# Patient Record
Sex: Female | Born: 1960
Health system: Southern US, Community
[De-identification: ages and names within clinical notes are randomized; demographics above are authoritative.]

## PROBLEM LIST (undated history)

## (undated) DIAGNOSIS — Z860100 Personal history of colon polyps, unspecified: Secondary | ICD-10-CM

## (undated) HISTORY — DX: Personal history of colon polyps, unspecified: Z86.0100

## (undated) HISTORY — DX: Personal history of colonic polyps: Z86.010

## (undated) HISTORY — PX: CHOLECYSTECTOMY: SHX55

## (undated) HISTORY — PX: INCISION / DRAINAGE HAND / FINGER: SUR695

## (undated) HISTORY — PX: ABDOMINAL HYSTERECTOMY: SHX81

---

## 1999-11-04 ENCOUNTER — Other Ambulatory Visit: Admission: RE | Admit: 1999-11-04 | Discharge: 1999-11-04 | Payer: Self-pay | Admitting: Obstetrics and Gynecology

## 2004-05-07 ENCOUNTER — Ambulatory Visit: Payer: Self-pay | Admitting: Obstetrics and Gynecology

## 2005-06-16 ENCOUNTER — Ambulatory Visit: Payer: Self-pay | Admitting: Internal Medicine

## 2005-06-17 ENCOUNTER — Ambulatory Visit: Payer: Self-pay | Admitting: Internal Medicine

## 2005-06-18 ENCOUNTER — Ambulatory Visit: Payer: Self-pay | Admitting: Obstetrics and Gynecology

## 2005-07-15 ENCOUNTER — Ambulatory Visit: Payer: Self-pay | Admitting: Surgery

## 2005-07-19 ENCOUNTER — Ambulatory Visit: Payer: Self-pay | Admitting: Gastroenterology

## 2006-02-07 ENCOUNTER — Inpatient Hospital Stay (HOSPITAL_COMMUNITY): Admission: RE | Admit: 2006-02-07 | Discharge: 2006-02-08 | Payer: Self-pay | Admitting: Obstetrics and Gynecology

## 2006-07-27 ENCOUNTER — Ambulatory Visit: Payer: Self-pay | Admitting: Obstetrics and Gynecology

## 2007-08-23 ENCOUNTER — Ambulatory Visit: Payer: Self-pay | Admitting: Internal Medicine

## 2008-12-11 LAB — HM MAMMOGRAPHY: HM Mammogram: NORMAL

## 2009-12-31 LAB — HEPATIC FUNCTION PANEL
AST: 20 U/L (ref 13–35)
Alkaline Phosphatase: 71 U/L (ref 25–125)
Bilirubin, Total: 0.4 mg/dL

## 2009-12-31 LAB — CBC AND DIFFERENTIAL
Platelets: 305 10*3/uL (ref 150–399)
WBC: 4.9 10^3/mL

## 2009-12-31 LAB — LIPID PANEL: LDL Cholesterol: 86 mg/dL

## 2010-01-27 ENCOUNTER — Ambulatory Visit: Payer: Self-pay | Admitting: Internal Medicine

## 2010-02-04 LAB — BASIC METABOLIC PANEL
BUN: 22 mg/dL — AB (ref 4–21)
Creatinine: 0.7 mg/dL (ref 0.5–1.1)
Potassium: 3.8 mmol/L (ref 3.4–5.3)

## 2010-05-01 ENCOUNTER — Ambulatory Visit: Payer: Self-pay | Admitting: Gastroenterology

## 2010-05-01 LAB — HM COLONOSCOPY: HM Colonoscopy: 2

## 2011-03-12 ENCOUNTER — Telehealth: Payer: Self-pay | Admitting: Internal Medicine

## 2011-03-12 DIAGNOSIS — Z1231 Encounter for screening mammogram for malignant neoplasm of breast: Secondary | ICD-10-CM

## 2011-03-12 DIAGNOSIS — Z1239 Encounter for other screening for malignant neoplasm of breast: Secondary | ICD-10-CM

## 2011-03-12 NOTE — Telephone Encounter (Signed)
Patient is needing an order for a mammogram.

## 2011-03-12 NOTE — Telephone Encounter (Signed)
Pending signature

## 2011-03-12 NOTE — Telephone Encounter (Signed)
Patient informed ready for pick up.

## 2011-05-05 ENCOUNTER — Ambulatory Visit: Payer: Self-pay | Admitting: Internal Medicine

## 2011-05-17 ENCOUNTER — Encounter: Payer: Self-pay | Admitting: Internal Medicine

## 2011-05-17 DIAGNOSIS — R040 Epistaxis: Secondary | ICD-10-CM

## 2011-05-17 DIAGNOSIS — I10 Essential (primary) hypertension: Secondary | ICD-10-CM

## 2011-05-17 DIAGNOSIS — M25569 Pain in unspecified knee: Secondary | ICD-10-CM

## 2011-06-01 ENCOUNTER — Other Ambulatory Visit: Payer: Self-pay | Admitting: Internal Medicine

## 2011-06-24 ENCOUNTER — Encounter: Payer: Self-pay | Admitting: Internal Medicine

## 2011-06-24 DIAGNOSIS — I10 Essential (primary) hypertension: Secondary | ICD-10-CM | POA: Insufficient documentation

## 2011-06-24 DIAGNOSIS — R040 Epistaxis: Secondary | ICD-10-CM | POA: Insufficient documentation

## 2011-06-24 DIAGNOSIS — M25569 Pain in unspecified knee: Secondary | ICD-10-CM | POA: Insufficient documentation

## 2011-06-24 LAB — HM MAMMOGRAPHY: HM Mammogram: NORMAL

## 2011-07-20 ENCOUNTER — Encounter: Payer: Self-pay | Admitting: Internal Medicine

## 2011-07-20 ENCOUNTER — Ambulatory Visit (INDEPENDENT_AMBULATORY_CARE_PROVIDER_SITE_OTHER): Payer: 59 | Admitting: Internal Medicine

## 2011-07-20 VITALS — BP 128/82 | HR 96 | Temp 98.7°F | Wt 160.2 lb

## 2011-07-20 DIAGNOSIS — Z0000 Encounter for general adult medical examination without abnormal findings: Secondary | ICD-10-CM

## 2011-07-20 DIAGNOSIS — I10 Essential (primary) hypertension: Secondary | ICD-10-CM

## 2011-07-20 LAB — COMPREHENSIVE METABOLIC PANEL
Albumin: 4.3 g/dL (ref 3.5–5.2)
Alkaline Phosphatase: 72 U/L (ref 39–117)
CO2: 27 mEq/L (ref 19–32)
Chloride: 103 mEq/L (ref 96–112)
Glucose, Bld: 88 mg/dL (ref 70–99)
Potassium: 3.8 mEq/L (ref 3.5–5.1)
Sodium: 140 mEq/L (ref 135–145)
Total Protein: 7.5 g/dL (ref 6.0–8.3)

## 2011-07-20 LAB — CBC WITH DIFFERENTIAL/PLATELET
Basophils Relative: 0.7 % (ref 0.0–3.0)
Hemoglobin: 15 g/dL (ref 12.0–15.0)
Lymphocytes Relative: 39.2 % (ref 12.0–46.0)
MCHC: 34.2 g/dL (ref 30.0–36.0)
Monocytes Relative: 6.5 % (ref 3.0–12.0)
Neutro Abs: 2.3 10*3/uL (ref 1.4–7.7)
RBC: 4.49 Mil/uL (ref 3.87–5.11)

## 2011-07-20 LAB — LIPID PANEL
Cholesterol: 188 mg/dL (ref 0–200)
LDL Cholesterol: 101 mg/dL — ABNORMAL HIGH (ref 0–99)

## 2011-07-20 NOTE — Progress Notes (Signed)
  Subjective:    Patient ID: Kristy Vega, female    DOB: 1960/07/31, 51 y.o.   MRN: [id number]  HPI 51YO female presents for annual exam. Doing well. No complaints today. Has been following healthy diet, increasing intake of vegetables. In regards to HTN, reports compliance with HCTZ.   Outpatient Encounter Prescriptions as of 07/20/2011  Medication Sig Dispense Refill  . hydrochlorothiazide (MICROZIDE) 12.5 MG capsule TAKE ONE CAPSULE DAILY  30 capsule  10    Review of Systems  Constitutional: Negative for fever, chills, appetite change, fatigue and unexpected weight change.  HENT: Negative for ear pain, congestion, sore throat, trouble swallowing, neck pain, voice change and sinus pressure.   Eyes: Negative for visual disturbance.  Respiratory: Negative for cough, shortness of breath, wheezing and stridor.   Cardiovascular: Negative for chest pain, palpitations and leg swelling.  Gastrointestinal: Negative for nausea, vomiting, abdominal pain, diarrhea, constipation, blood in stool, abdominal distention and anal bleeding.  Genitourinary: Negative for dysuria and flank pain.  Musculoskeletal: Positive for arthralgias (knee pain bilaterally). Negative for myalgias and gait problem.  Skin: Negative for color change and rash.  Neurological: Negative for dizziness and headaches.  Hematological: Negative for adenopathy. Does not bruise/bleed easily.  Psychiatric/Behavioral: Negative for suicidal ideas, sleep disturbance and dysphoric mood. The patient is not nervous/anxious.    BP 128/82  Pulse 96  Temp(Src) 98.7 F (37.1 C) (Oral)  Wt 160 lb 4 oz (72.689 kg)  SpO2 99%     Objective:   Physical Exam  Constitutional: She is oriented to person, place, and time. She appears well-developed and well-nourished. No distress.  HENT:  Head: Normocephalic and atraumatic.  Right Ear: External ear normal.  Left Ear: External ear normal.  Nose: Nose normal.  Mouth/Throat: Oropharynx is clear  and moist. No oropharyngeal exudate.  Eyes: Conjunctivae are normal. Pupils are equal, round, and reactive to light. Right eye exhibits no discharge. Left eye exhibits no discharge. No scleral icterus.  Neck: Normal range of motion. Neck supple. No tracheal deviation present. No thyromegaly present.  Cardiovascular: Normal rate, regular rhythm, normal heart sounds and intact distal pulses.  Exam reveals no gallop and no friction rub.   No murmur heard. Pulmonary/Chest: Effort normal and breath sounds normal. No accessory muscle usage. Not tachypneic. No respiratory distress. She has no wheezes. She has no rales. She exhibits no tenderness. Right breast exhibits no inverted nipple, no mass, no nipple discharge, no skin change and no tenderness. Left breast exhibits no inverted nipple, no mass, no nipple discharge, no skin change and no tenderness. Breasts are symmetrical.  Abdominal: Soft. Bowel sounds are normal. She exhibits no distension. There is no tenderness.  Musculoskeletal: Normal range of motion. She exhibits no edema and no tenderness.  Lymphadenopathy:    She has no cervical adenopathy.  Neurological: She is alert and oriented to person, place, and time. No cranial nerve deficit. She exhibits normal muscle tone. Coordination normal.  Skin: Skin is warm and dry. No rash noted. She is not diaphoretic. No erythema. No pallor.  Psychiatric: She has a normal mood and affect. Her behavior is normal. Judgment and thought content normal.          Assessment & Plan:

## 2011-07-20 NOTE — Assessment & Plan Note (Signed)
General exam including breast exam is normal today. Mammogram UTD 04/2011.  PAP UTD 2011.  Colonoscopy UTD 2012. Will check basic labs including CBC, CMP, lipids today. Encouraged continued efforts at diet and exercise. Follow up 1 year for repeat physical.

## 2011-07-20 NOTE — Assessment & Plan Note (Signed)
BP well controlled today. Will continue HCTZ.  Will check renal function with labs today. Follow up 6 months.

## 2012-01-19 ENCOUNTER — Encounter: Payer: Self-pay | Admitting: Internal Medicine

## 2012-01-19 ENCOUNTER — Ambulatory Visit (INDEPENDENT_AMBULATORY_CARE_PROVIDER_SITE_OTHER): Payer: PRIVATE HEALTH INSURANCE | Admitting: Internal Medicine

## 2012-01-19 VITALS — BP 160/110 | HR 72 | Temp 98.0°F | Ht 62.0 in | Wt 160.0 lb

## 2012-01-19 DIAGNOSIS — F419 Anxiety disorder, unspecified: Secondary | ICD-10-CM

## 2012-01-19 DIAGNOSIS — I10 Essential (primary) hypertension: Secondary | ICD-10-CM

## 2012-01-19 DIAGNOSIS — F439 Reaction to severe stress, unspecified: Secondary | ICD-10-CM | POA: Insufficient documentation

## 2012-01-19 DIAGNOSIS — F411 Generalized anxiety disorder: Secondary | ICD-10-CM

## 2012-01-19 DIAGNOSIS — R002 Palpitations: Secondary | ICD-10-CM

## 2012-01-19 LAB — COMPREHENSIVE METABOLIC PANEL
BUN: 10 mg/dL (ref 6–23)
CO2: 29 mEq/L (ref 19–32)
Calcium: 8.6 mg/dL (ref 8.4–10.5)
Chloride: 104 mEq/L (ref 96–112)
Creatinine, Ser: 0.6 mg/dL (ref 0.4–1.2)
Glucose, Bld: 71 mg/dL (ref 70–99)

## 2012-01-19 LAB — MICROALBUMIN / CREATININE URINE RATIO: Microalb, Ur: 0.1 mg/dL (ref 0.0–1.9)

## 2012-01-19 MED ORDER — METOPROLOL SUCCINATE ER 25 MG PO TB24: 25.0000 mg | ORAL_TABLET | Freq: Every day | ORAL | Status: DC

## 2012-01-19 NOTE — Assessment & Plan Note (Signed)
BP elevated today. Suspect this may be related to increased anxiety/stress with work.  Having some associated palpitations which are likely also related. Will start Metoprolol 25mg  daily. Pt will monitor BP at work (works in [employer] office). Call if BP consistently >140/90. Follow up 1 month.

## 2012-01-19 NOTE — Patient Instructions (Signed)
[email or web address] 

## 2012-01-19 NOTE — Progress Notes (Signed)
  Subjective:    Patient ID: Kristy Vega, female    DOB: 15-Oct-1960, 51 y.o.   MRN: [id number]  HPI 51YO female with h/o HTN presents for follow up.  Doing well. Notes some increased stress at work. Working longer hours. Notes some occasional neck strain with long hours at computer. Also occasionally having palpitations but no chest pain. Taking HCTZ. BP over last few months mostly 120s/80s per her report.   Outpatient Prescriptions Prior to Visit  Medication Sig Dispense Refill  . hydrochlorothiazide (MICROZIDE) 12.5 MG capsule TAKE ONE CAPSULE DAILY  30 capsule  10   BP 160/110  Pulse 72  Temp 98 F (36.7 C) (Oral)  Ht 5\' 2"  (1.575 m)  Wt 160 lb (72.576 kg)  BMI 29.26 kg/m2  SpO2 98%  Review of Systems  Constitutional: Negative for fever, chills, appetite change, fatigue and unexpected weight change.  HENT: Positive for neck pain. Negative for ear pain, congestion, sore throat, trouble swallowing, voice change and sinus pressure.   Eyes: Negative for visual disturbance.  Respiratory: Negative for cough, shortness of breath, wheezing and stridor.   Cardiovascular: Positive for palpitations. Negative for chest pain and leg swelling.  Gastrointestinal: Negative for nausea, vomiting, abdominal pain, diarrhea, constipation, blood in stool, abdominal distention and anal bleeding.  Genitourinary: Negative for dysuria and flank pain.  Musculoskeletal: Negative for myalgias, arthralgias and gait problem.  Skin: Negative for color change and rash.  Neurological: Positive for headaches. Negative for dizziness.  Hematological: Negative for adenopathy. Does not bruise/bleed easily.  Psychiatric/Behavioral: Negative for suicidal ideas, disturbed wake/sleep cycle and dysphoric mood. The patient is nervous/anxious.        Objective:   Physical Exam  Constitutional: She is oriented to person, place, and time. She appears well-developed and well-nourished. No distress.  HENT:  Head:  Normocephalic and atraumatic.  Right Ear: External ear normal.  Left Ear: External ear normal.  Nose: Nose normal.  Mouth/Throat: Oropharynx is clear and moist. No oropharyngeal exudate.  Eyes: Conjunctivae normal are normal. Pupils are equal, round, and reactive to light. Right eye exhibits no discharge. Left eye exhibits no discharge. No scleral icterus.  Neck: Normal range of motion. Neck supple. No tracheal deviation present. No thyromegaly present.  Cardiovascular: Normal rate, regular rhythm, normal heart sounds and intact distal pulses.  Exam reveals no gallop and no friction rub.   No murmur heard. Pulmonary/Chest: Effort normal and breath sounds normal. No respiratory distress. She has no wheezes. She has no rales. She exhibits no tenderness.  Musculoskeletal: Normal range of motion. She exhibits no edema and no tenderness.  Lymphadenopathy:    She has no cervical adenopathy.  Neurological: She is alert and oriented to person, place, and time. No cranial nerve deficit. She exhibits normal muscle tone. Coordination normal.  Skin: Skin is warm and dry. No rash noted. She is not diaphoretic. No erythema. No pallor.  Psychiatric: She has a normal mood and affect. Her behavior is normal. Judgment and thought content normal.          Assessment & Plan:

## 2012-01-19 NOTE — Assessment & Plan Note (Addendum)
Likely secondary to increased stress at work and anxiety. As above, starting metoprolol to help with both symptoms of palpitations and elevated blood pressure. Patient will call if symptoms are worsening. Followup in one month.

## 2012-01-19 NOTE — Assessment & Plan Note (Signed)
Recent symptoms of anxiety related to work stressors. Patient is not currently interested in starting any medication for this. Encouraged her to take time for herself and to try setting aside time for exercise. Followup one month.

## 2012-05-27 ENCOUNTER — Other Ambulatory Visit: Payer: Self-pay

## 2012-06-09 ENCOUNTER — Encounter: Payer: Self-pay | Admitting: Internal Medicine

## 2012-06-25 ENCOUNTER — Encounter: Payer: Self-pay | Admitting: Internal Medicine

## 2012-06-26 ENCOUNTER — Other Ambulatory Visit: Payer: Self-pay | Admitting: Internal Medicine

## 2012-06-26 ENCOUNTER — Telehealth: Payer: Self-pay | Admitting: Emergency Medicine

## 2012-06-26 ENCOUNTER — Other Ambulatory Visit: Payer: Self-pay | Admitting: *Deleted

## 2012-06-26 DIAGNOSIS — Z1239 Encounter for other screening for malignant neoplasm of breast: Secondary | ICD-10-CM

## 2012-06-26 MED ORDER — HYDROCHLOROTHIAZIDE 12.5 MG PO CAPS: ORAL_CAPSULE | ORAL | Status: DC

## 2012-06-26 NOTE — Telephone Encounter (Signed)
Rx faxed to pharmacy on file., [facility] per patient request.

## 2012-07-18 ENCOUNTER — Ambulatory Visit: Payer: Self-pay | Admitting: Internal Medicine

## 2012-07-24 ENCOUNTER — Other Ambulatory Visit (HOSPITAL_COMMUNITY)
Admission: RE | Admit: 2012-07-24 | Discharge: 2012-07-24 | Disposition: A | Discharge status: Home / Self Care | Payer: PRIVATE HEALTH INSURANCE | Source: Ambulatory Visit | Attending: Internal Medicine | Admitting: Internal Medicine

## 2012-07-24 ENCOUNTER — Encounter: Payer: Self-pay | Admitting: Internal Medicine

## 2012-07-24 ENCOUNTER — Ambulatory Visit (INDEPENDENT_AMBULATORY_CARE_PROVIDER_SITE_OTHER): Payer: PRIVATE HEALTH INSURANCE | Admitting: Internal Medicine

## 2012-07-24 VITALS — BP 128/88 | HR 72 | Temp 98.3°F | Ht 61.75 in | Wt 164.0 lb

## 2012-07-24 DIAGNOSIS — Z01419 Encounter for gynecological examination (general) (routine) without abnormal findings: Secondary | ICD-10-CM | POA: Insufficient documentation

## 2012-07-24 DIAGNOSIS — Z0000 Encounter for general adult medical examination without abnormal findings: Secondary | ICD-10-CM | POA: Insufficient documentation

## 2012-07-24 DIAGNOSIS — I10 Essential (primary) hypertension: Secondary | ICD-10-CM | POA: Insufficient documentation

## 2012-07-24 DIAGNOSIS — Z1151 Encounter for screening for human papillomavirus (HPV): Secondary | ICD-10-CM | POA: Insufficient documentation

## 2012-07-24 LAB — CBC WITH DIFFERENTIAL/PLATELET
Basophils Absolute: 0 10*3/uL (ref 0.0–0.1)
Eosinophils Absolute: 0.1 10*3/uL (ref 0.0–0.7)
Lymphocytes Relative: 40 % (ref 12.0–46.0)
MCHC: 33.8 g/dL (ref 30.0–36.0)
Monocytes Absolute: 0.4 10*3/uL (ref 0.1–1.0)
Neutrophils Relative %: 49.5 % (ref 43.0–77.0)
Platelets: 245 10*3/uL (ref 150.0–400.0)
RBC: 4.41 Mil/uL (ref 3.87–5.11)
RDW: 12.1 % (ref 11.5–14.6)

## 2012-07-24 LAB — MICROALBUMIN / CREATININE URINE RATIO
Creatinine,U: 17.6 mg/dL
Microalb, Ur: 0.5 mg/dL (ref 0.0–1.9)

## 2012-07-24 LAB — LIPID PANEL
Cholesterol: 207 mg/dL — ABNORMAL HIGH (ref 0–200)
HDL: 68 mg/dL (ref >39.00)
Triglycerides: 78 mg/dL (ref 0.0–149.0)
VLDL: 15.6 mg/dL (ref 0.0–40.0)

## 2012-07-24 LAB — COMPREHENSIVE METABOLIC PANEL
ALT: 15 U/L (ref 0–35)
BUN: 11 mg/dL (ref 6–23)
CO2: 30 mEq/L (ref 19–32)
Creatinine, Ser: 0.6 mg/dL (ref 0.4–1.2)
GFR: 103.03 mL/min (ref >60.00)
Total Bilirubin: 0.7 mg/dL (ref 0.3–1.2)

## 2012-07-24 LAB — TSH: TSH: 2.41 u[IU]/mL (ref 0.35–5.50)

## 2012-07-24 NOTE — Assessment & Plan Note (Signed)
BP Readings from Last 3 Encounters:  07/24/12 128/88  01/19/12 160/110  07/20/11 128/82   BP well controlled at home and work (pt works in [employer] office). Will continue current medications. Pt will call or email if BP persistently >140/90

## 2012-07-24 NOTE — Assessment & Plan Note (Signed)
General medical exam including breast and pelvic exam normal today. Pap is pending. Health maintenance is up to date including mammogram and colonoscopy. Will check basic labs today including CMP, CBC, lipid profile. Followup in 6 months or sooner as needed.

## 2012-07-24 NOTE — Progress Notes (Signed)
Subjective:    Patient ID: Kristy Vega, female    DOB: 1960-05-01, 52 y.o.   MRN: [id number]  HPI  52 year old female presents for annual exam. She reports she is generally feeling well. She notes some increased stress at her work. However, she reports her blood pressure has been well-controlled, typically 120s over 80s. She is compliant with her medication. She denies any recent chest pain, palpitations, headache. She tries to follow a healthy diet. She does not participate in regular exercise but has been increasing her physical activity with yard work. No new concerns today.  Outpatient Encounter Prescriptions as of 07/24/2012  Medication Sig Dispense Refill  . hydrochlorothiazide (MICROZIDE) 12.5 MG capsule TAKE ONE CAPSULE DAILY  30 capsule  3  . metoprolol succinate (TOPROL-XL) 25 MG 24 hr tablet Take 1 tablet (25 mg total) by mouth daily.  30 tablet  6  . Multiple Vitamin (MULTIVITAMIN) tablet Take 1 tablet by mouth daily.       No facility-administered encounter medications on file as of 07/24/2012.   BP 128/88  Pulse 72  Temp(Src) 98.3 F (36.8 C) (Oral)  Ht 5' 1.75" (1.568 m)  Wt 164 lb (74.39 kg)  BMI 30.26 kg/m2  SpO2 97%  Review of Systems  Constitutional: Negative for fever, chills, appetite change, fatigue and unexpected weight change.  HENT: Negative for ear pain, congestion, sore throat, trouble swallowing, neck pain, voice change and sinus pressure.   Eyes: Negative for visual disturbance.  Respiratory: Negative for cough, shortness of breath, wheezing and stridor.   Cardiovascular: Negative for chest pain, palpitations and leg swelling.  Gastrointestinal: Negative for nausea, vomiting, abdominal pain, diarrhea, constipation, blood in stool, abdominal distention and anal bleeding.  Genitourinary: Negative for dysuria and flank pain.  Musculoskeletal: Negative for myalgias, arthralgias and gait problem.  Skin: Negative for color change and rash.  Neurological:  Negative for dizziness and headaches.  Hematological: Negative for adenopathy. Does not bruise/bleed easily.  Psychiatric/Behavioral: Negative for suicidal ideas, sleep disturbance and dysphoric mood. The patient is not nervous/anxious.        Objective:   Physical Exam  Constitutional: She is oriented to person, place, and time. She appears well-developed and well-nourished. No distress.  HENT:  Head: Normocephalic and atraumatic.  Right Ear: External ear normal.  Left Ear: External ear normal.  Nose: Nose normal.  Mouth/Throat: Oropharynx is clear and moist. No oropharyngeal exudate.  Eyes: Conjunctivae are normal. Pupils are equal, round, and reactive to light. Right eye exhibits no discharge. Left eye exhibits no discharge. No scleral icterus.  Neck: Normal range of motion. Neck supple. No tracheal deviation present. No thyromegaly present.  Cardiovascular: Normal rate, regular rhythm, normal heart sounds and intact distal pulses.  Exam reveals no gallop and no friction rub.   No murmur heard. Pulmonary/Chest: Effort normal and breath sounds normal. No respiratory distress. She has no wheezes. She has no rales. She exhibits no tenderness.  Abdominal: Soft. Bowel sounds are normal. She exhibits no distension and no mass. There is no tenderness. There is no rebound and no guarding.  Genitourinary: Rectum normal and vagina normal. No breast swelling, tenderness, discharge or bleeding. Pelvic exam was performed with patient supine. There is no rash, tenderness or lesion on the right labia. There is no rash, tenderness or lesion on the left labia. Cervix exhibits no motion tenderness, no discharge and no friability. Right adnexum displays no mass, no tenderness and no fullness. Left adnexum displays no mass, no tenderness and  no fullness. No erythema or tenderness around the vagina. No vaginal discharge found.  Uterus surgically absent  Musculoskeletal: Normal range of motion. She exhibits no  edema and no tenderness.  Lymphadenopathy:    She has no cervical adenopathy.  Neurological: She is alert and oriented to person, place, and time. No cranial nerve deficit. She exhibits normal muscle tone. Coordination normal.  Skin: Skin is warm and dry. No rash noted. She is not diaphoretic. No erythema. No pallor.  Psychiatric: She has a normal mood and affect. Her behavior is normal. Judgment and thought content normal.          Assessment & Plan:

## 2012-07-30 LAB — HM PAP SMEAR: HM Pap smear: NEGATIVE

## 2012-07-31 ENCOUNTER — Encounter: Payer: Self-pay | Admitting: Internal Medicine

## 2012-08-09 ENCOUNTER — Encounter: Payer: Self-pay | Admitting: Internal Medicine

## 2012-09-03 ENCOUNTER — Other Ambulatory Visit: Payer: Self-pay | Admitting: Internal Medicine

## 2012-09-05 NOTE — Telephone Encounter (Signed)
Eprescribed.

## 2012-11-19 ENCOUNTER — Other Ambulatory Visit: Payer: Self-pay | Admitting: Internal Medicine

## 2012-11-20 NOTE — Telephone Encounter (Signed)
Eprescribed.

## 2013-01-17 ENCOUNTER — Other Ambulatory Visit: Payer: Self-pay | Admitting: *Deleted

## 2013-01-17 ENCOUNTER — Encounter: Payer: Self-pay | Admitting: Internal Medicine

## 2013-01-24 ENCOUNTER — Encounter: Payer: Self-pay | Admitting: Internal Medicine

## 2013-01-24 ENCOUNTER — Ambulatory Visit (INDEPENDENT_AMBULATORY_CARE_PROVIDER_SITE_OTHER): Payer: PRIVATE HEALTH INSURANCE | Admitting: Internal Medicine

## 2013-01-24 VITALS — BP 150/100 | HR 75 | Temp 98.3°F | Wt 166.0 lb

## 2013-01-24 DIAGNOSIS — I10 Essential (primary) hypertension: Secondary | ICD-10-CM

## 2013-01-24 LAB — COMPREHENSIVE METABOLIC PANEL
BUN: 14 mg/dL (ref 6–23)
CO2: 32 mEq/L (ref 19–32)
Calcium: 9.5 mg/dL (ref 8.4–10.5)
Chloride: 100 mEq/L (ref 96–112)
Creatinine, Ser: 0.7 mg/dL (ref 0.4–1.2)
GFR: 97.54 mL/min (ref >60.00)
Total Bilirubin: 0.5 mg/dL (ref 0.3–1.2)

## 2013-01-24 LAB — MICROALBUMIN / CREATININE URINE RATIO
Creatinine,U: 46.7 mg/dL
Microalb Creat Ratio: 0.4 mg/g (ref 0.0–30.0)

## 2013-01-24 MED ORDER — METOPROLOL SUCCINATE ER 50 MG PO TB24: 50.0000 mg | ORAL_TABLET | Freq: Every day | ORAL | Status: DC

## 2013-01-24 NOTE — Assessment & Plan Note (Signed)
BP Readings from Last 3 Encounters:  01/24/13 150/100  07/24/12 128/88  01/19/12 160/110   Blood pressure continues to be elevated. Will increase metoprolol to 50 mg daily. Patient will monitor her blood pressure at work. Followup 6 months or sooner as needed.

## 2013-01-24 NOTE — Progress Notes (Signed)
  Subjective:    Patient ID: Kristy Vega, female    DOB: 1960-12-05, 52 y.o.   MRN: [id number]  HPI 52 year old female with history of hypertension presents for followup. She reports she is generally feeling well. No concerns today. She is compliant with medication. When she checks her blood pressure at work it typically runs greater than 140/90.  Outpatient Encounter Prescriptions as of 01/24/2013  Medication Sig Dispense Refill  . hydrochlorothiazide (MICROZIDE) 12.5 MG capsule TAKE 1 TABLET DAILY  30 capsule  3  . Multiple Vitamin (MULTIVITAMIN) tablet Take 1 tablet by mouth daily.      . metoprolol succinate (TOPROL-XL) 50 MG 24 hr tablet Take 1 tablet (50 mg total) by mouth daily. Take with or immediately following a meal.  90 tablet  3   No facility-administered encounter medications on file as of 01/24/2013.   BP 150/100  Pulse 75  Temp(Src) 98.3 F (36.8 C) (Oral)  Wt 166 lb (75.297 kg)  BMI 30.63 kg/m2  SpO2 95%  Review of Systems  Constitutional: Negative for fever, chills, appetite change, fatigue and unexpected weight change.  HENT: Negative for congestion, ear pain, sinus pressure, sore throat, trouble swallowing and voice change.   Eyes: Negative for visual disturbance.  Respiratory: Negative for cough, shortness of breath, wheezing and stridor.   Cardiovascular: Negative for chest pain, palpitations and leg swelling.  Gastrointestinal: Negative for nausea, vomiting, abdominal pain, diarrhea, constipation, blood in stool, abdominal distention and anal bleeding.  Genitourinary: Negative for dysuria and flank pain.  Musculoskeletal: Negative for arthralgias, gait problem, myalgias and neck pain.  Skin: Negative for color change and rash.  Neurological: Negative for dizziness and headaches.  Hematological: Negative for adenopathy. Does not bruise/bleed easily.  Psychiatric/Behavioral: Negative for suicidal ideas, sleep disturbance and dysphoric mood. The patient is not  nervous/anxious.        Objective:   Physical Exam  Constitutional: She is oriented to person, place, and time. She appears well-developed and well-nourished. No distress.  HENT:  Head: Normocephalic and atraumatic.  Right Ear: External ear normal.  Left Ear: External ear normal.  Nose: Nose normal.  Mouth/Throat: Oropharynx is clear and moist. No oropharyngeal exudate.  Eyes: Conjunctivae are normal. Pupils are equal, round, and reactive to light. Right eye exhibits no discharge. Left eye exhibits no discharge. No scleral icterus.  Neck: Normal range of motion. Neck supple. No tracheal deviation present. No thyromegaly present.  Cardiovascular: Normal rate, regular rhythm, normal heart sounds and intact distal pulses.  Exam reveals no gallop and no friction rub.   No murmur heard. Pulmonary/Chest: Effort normal and breath sounds normal. No accessory muscle usage. Not tachypneic. No respiratory distress. She has no decreased breath sounds. She has no wheezes. She has no rhonchi. She has no rales. She exhibits no tenderness.  Musculoskeletal: Normal range of motion. She exhibits no edema and no tenderness.  Lymphadenopathy:    She has no cervical adenopathy.  Neurological: She is alert and oriented to person, place, and time. No cranial nerve deficit. She exhibits normal muscle tone. Coordination normal.  Skin: Skin is warm and dry. No rash noted. She is not diaphoretic. No erythema. No pallor.  Psychiatric: She has a normal mood and affect. Her behavior is normal. Judgment and thought content normal.          Assessment & Plan:

## 2013-04-18 ENCOUNTER — Encounter: Payer: Self-pay | Admitting: Internal Medicine

## 2013-04-18 MED ORDER — HYDROCHLOROTHIAZIDE 12.5 MG PO CAPS: ORAL_CAPSULE | ORAL | Status: DC

## 2013-07-12 ENCOUNTER — Encounter: Payer: Self-pay | Admitting: *Deleted

## 2013-07-16 ENCOUNTER — Encounter: Payer: Self-pay | Admitting: Internal Medicine

## 2013-07-24 ENCOUNTER — Encounter: Payer: PRIVATE HEALTH INSURANCE | Admitting: Internal Medicine

## 2013-08-02 ENCOUNTER — Encounter: Payer: Self-pay | Admitting: Internal Medicine

## 2013-08-16 ENCOUNTER — Encounter: Payer: PRIVATE HEALTH INSURANCE | Admitting: Internal Medicine

## 2013-08-29 ENCOUNTER — Encounter: Payer: Self-pay | Admitting: Internal Medicine

## 2013-08-29 ENCOUNTER — Ambulatory Visit: Payer: Self-pay | Admitting: Internal Medicine

## 2013-08-29 ENCOUNTER — Ambulatory Visit (INDEPENDENT_AMBULATORY_CARE_PROVIDER_SITE_OTHER): Payer: PRIVATE HEALTH INSURANCE | Admitting: Internal Medicine

## 2013-08-29 VITALS — BP 128/98 | HR 67 | Temp 97.7°F | Ht 62.0 in | Wt 162.8 lb

## 2013-08-29 DIAGNOSIS — I10 Essential (primary) hypertension: Secondary | ICD-10-CM

## 2013-08-29 DIAGNOSIS — Z0000 Encounter for general adult medical examination without abnormal findings: Secondary | ICD-10-CM

## 2013-08-29 LAB — CBC WITH DIFFERENTIAL/PLATELET
Basophils Absolute: 0 10*3/uL (ref 0.0–0.1)
Basophils Relative: 0.3 % (ref 0.0–3.0)
Eosinophils Absolute: 0.1 10*3/uL (ref 0.0–0.7)
Eosinophils Relative: 2.6 % (ref 0.0–5.0)
HCT: 44.7 % (ref 36.0–46.0)
Hemoglobin: 15.2 g/dL — ABNORMAL HIGH (ref 12.0–15.0)
Lymphocytes Relative: 37.7 % (ref 12.0–46.0)
Lymphs Abs: 2 10*3/uL (ref 0.7–4.0)
MCHC: 34 g/dL (ref 30.0–36.0)
MCV: 97.6 fl (ref 78.0–100.0)
Monocytes Absolute: 0.3 10*3/uL (ref 0.1–1.0)
Monocytes Relative: 5.9 % (ref 3.0–12.0)
Neutro Abs: 2.8 10*3/uL (ref 1.4–7.7)
Neutrophils Relative %: 53.5 % (ref 43.0–77.0)
Platelets: 270 10*3/uL (ref 150.0–400.0)
RBC: 4.58 Mil/uL (ref 3.87–5.11)
RDW: 12.4 % (ref 11.5–15.5)
WBC: 5.2 10*3/uL (ref 4.0–10.5)

## 2013-08-29 LAB — COMPREHENSIVE METABOLIC PANEL
ALT: 12 U/L (ref 0–35)
AST: 18 U/L (ref 0–37)
Albumin: 4.1 g/dL (ref 3.5–5.2)
Alkaline Phosphatase: 81 U/L (ref 39–117)
BUN: 11 mg/dL (ref 6–23)
CO2: 30 mEq/L (ref 19–32)
Calcium: 9.2 mg/dL (ref 8.4–10.5)
Chloride: 101 mEq/L (ref 96–112)
Creatinine, Ser: 0.6 mg/dL (ref 0.4–1.2)
GFR: 117.28 mL/min (ref >60.00)
Glucose, Bld: 89 mg/dL (ref 70–99)
Potassium: 4 mEq/L (ref 3.5–5.1)
Sodium: 137 mEq/L (ref 135–145)
Total Bilirubin: 0.7 mg/dL (ref 0.2–1.2)
Total Protein: 7.2 g/dL (ref 6.0–8.3)

## 2013-08-29 LAB — TSH: TSH: 2.23 u[IU]/mL (ref 0.35–4.50)

## 2013-08-29 LAB — LIPID PANEL
Cholesterol: 215 mg/dL — ABNORMAL HIGH (ref 0–200)
HDL: 73.9 mg/dL (ref >39.00)
LDL Cholesterol: 127 mg/dL — ABNORMAL HIGH (ref 0–99)
Total CHOL/HDL Ratio: 3
Triglycerides: 72 mg/dL (ref 0.0–149.0)
VLDL: 14.4 mg/dL (ref 0.0–40.0)

## 2013-08-29 LAB — MICROALBUMIN / CREATININE URINE RATIO
Creatinine,U: 35.9 mg/dL
Microalb Creat Ratio: 1.4 mg/g (ref 0.0–30.0)
Microalb, Ur: 0.5 mg/dL (ref 0.0–1.9)

## 2013-08-29 LAB — HM MAMMOGRAPHY: HM Mammogram: NEGATIVE

## 2013-08-29 MED ORDER — METOPROLOL SUCCINATE ER 50 MG PO TB24: 50.0000 mg | ORAL_TABLET | Freq: Every day | ORAL | Status: DC

## 2013-08-29 MED ORDER — HYDROCHLOROTHIAZIDE 12.5 MG PO CAPS: ORAL_CAPSULE | ORAL | Status: DC

## 2013-08-29 NOTE — Progress Notes (Signed)
Pre visit review using our clinic review tool, if applicable. No additional management support is needed unless otherwise documented below in the visit note. 

## 2013-08-29 NOTE — Assessment & Plan Note (Signed)
BP Readings from Last 3 Encounters:  08/29/13 128/98  01/24/13 150/100  07/24/12 128/88   BP continues to be elevated, however improved compared to previous. 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  Recommended checking renal artery doppler for RAS. Will set this up. Will check renal function with labs today. She will email with BP readings. Follow up in 6 months and prn.

## 2013-08-29 NOTE — Assessment & Plan Note (Addendum)
General medical exam including breast exam normal today. PAP and pelvic deferred as PAP normal, HPV neg in 2014. Mammogram ordered. Colonoscopy UTD. Will check basic labs today including CMP, CBC, lipid profile. Encouraged continued health diet and exercise. Recommended that she consider Zostavax. Followup in 6 months or sooner as needed.

## 2013-08-29 NOTE — Progress Notes (Signed)
Subjective:    Patient ID: Kristy Vega, female    DOB: 1960/12/30, 53 y.o.   MRN: [id number]  HPI [name] presents for annual exam. Feeling well. Trying to follow healthy diet. Exercising by walking, using pedometer to try to get 10000 steps daily. Has not been checking BP. Compliant with meds. No headache, chest pain, palpitations.  Review of Systems  Constitutional: Negative for fever, chills, appetite change, fatigue and unexpected weight change.  HENT: Negative for congestion, ear pain, sinus pressure, sore throat, trouble swallowing and voice change.   Eyes: Negative for visual disturbance.  Respiratory: Negative for cough, shortness of breath, wheezing and stridor.   Cardiovascular: Negative for chest pain, palpitations and leg swelling.  Gastrointestinal: Negative for nausea, vomiting, abdominal pain, diarrhea, constipation, blood in stool, abdominal distention and anal bleeding.  Genitourinary: Negative for dysuria and flank pain.  Musculoskeletal: Negative for arthralgias, gait problem, myalgias and neck pain.  Skin: Negative for color change and rash.  Neurological: Negative for dizziness and headaches.  Hematological: Negative for adenopathy. Does not bruise/bleed easily.  Psychiatric/Behavioral: Negative for suicidal ideas, sleep disturbance and dysphoric mood. The patient is not nervous/anxious.        Objective:    BP 128/98  Pulse 67  Temp(Src) 97.7 F (36.5 C) (Oral)  Ht 5\' 2"  (1.575 m)  Wt 162 lb 12 oz (73.823 kg)  BMI 29.76 kg/m2  SpO2 97% Physical Exam  Constitutional: She is oriented to person, place, and time. She appears well-developed and well-nourished. No distress.  HENT:  Head: Normocephalic and atraumatic.  Right Ear: External ear normal.  Left Ear: External ear normal.  Nose: Nose normal.  Mouth/Throat: Oropharynx is clear and moist. No oropharyngeal exudate.  Eyes: Conjunctivae are normal. Pupils are equal, round, and reactive to light.  Right eye exhibits no discharge. Left eye exhibits no discharge. No scleral icterus.  Neck: Normal range of motion. Neck supple. No tracheal deviation present. No thyromegaly present.  Cardiovascular: Normal rate, regular rhythm, normal heart sounds and intact distal pulses.  Exam reveals no gallop and no friction rub.   No murmur heard. Pulmonary/Chest: Effort normal and breath sounds normal. No accessory muscle usage. Not tachypneic. No respiratory distress. She has no decreased breath sounds. She has no wheezes. She has no rales. She exhibits no tenderness. Right breast exhibits no inverted nipple, no mass, no nipple discharge, no skin change and no tenderness. Left breast exhibits no inverted nipple, no mass, no nipple discharge, no skin change and no tenderness. Breasts are symmetrical.  Abdominal: Soft. Bowel sounds are normal. She exhibits no distension and no mass. There is no tenderness. There is no rebound and no guarding.  Musculoskeletal: Normal range of motion. She exhibits no edema and no tenderness.  Lymphadenopathy:    She has no cervical adenopathy.  Neurological: She is alert and oriented to person, place, and time. No cranial nerve deficit. She exhibits normal muscle tone. Coordination normal.  Skin: Skin is warm and dry. No rash noted. She is not diaphoretic. No erythema. No pallor.  Psychiatric: She has a normal mood and affect. Her behavior is normal. Judgment and thought content normal.          Assessment & Plan:   Problem List Items Addressed This Visit     Unprioritized   Essential hypertension, benign      BP Readings from Last 3 Encounters:  08/29/13 128/98  01/24/13 150/100  07/24/12 128/88   BP continues to be  elevated, however improved compared to previous. 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  Recommended checking renal artery doppler for RAS. Will set this up. Will check renal function with labs today. She will email with BP  readings. Follow up in 6 months and prn.    Relevant Medications      metoprolol succinate (TOPROL-XL) 24 hr tablet      hydrochlorothiazide (MICROZIDE) 12.5 MG capsule   Other Relevant Orders      Ambulatory referral to Vascular Surgery   Routine general medical examination at a health care facility - Primary     General medical exam including breast exam normal today. PAP and pelvic deferred as PAP normal, HPV neg in 2014. Mammogram ordered. Colonoscopy UTD. Will check basic labs today including CMP, CBC, lipid profile. Encouraged continued health diet and exercise. Recommended that she consider Zostavax. Followup in 6 months or sooner as needed.      Relevant Orders      CBC with Differential      Comprehensive metabolic panel      Lipid panel      Microalbumin / creatinine urine ratio      Vit D  25 hydroxy (rtn osteoporosis monitoring)      TSH       Return in about 6 months (around 03/01/2014) for Recheck.

## 2013-08-30 ENCOUNTER — Telehealth: Payer: Self-pay | Admitting: Internal Medicine

## 2013-08-30 LAB — VITAMIN D 25 HYDROXY (VIT D DEFICIENCY, FRACTURES): VIT D 25 HYDROXY: 52 ng/mL (ref 30–89)

## 2013-08-30 NOTE — Telephone Encounter (Signed)
Relevant patient education mailed to patient.  

## 2013-09-04 ENCOUNTER — Encounter: Payer: Self-pay | Admitting: Internal Medicine

## 2013-09-06 ENCOUNTER — Encounter: Payer: Self-pay | Admitting: Internal Medicine

## 2013-09-14 ENCOUNTER — Encounter: Payer: Self-pay | Admitting: Internal Medicine

## 2013-10-23 ENCOUNTER — Encounter: Payer: Self-pay | Admitting: Internal Medicine

## 2013-11-28 ENCOUNTER — Encounter: Payer: Self-pay | Admitting: Internal Medicine

## 2013-11-28 ENCOUNTER — Ambulatory Visit (INDEPENDENT_AMBULATORY_CARE_PROVIDER_SITE_OTHER): Payer: PRIVATE HEALTH INSURANCE | Admitting: *Deleted

## 2013-11-28 ENCOUNTER — Telehealth: Payer: Self-pay | Admitting: *Deleted

## 2013-11-28 VITALS — BP 146/88 | HR 75

## 2013-11-28 DIAGNOSIS — I10 Essential (primary) hypertension: Secondary | ICD-10-CM

## 2013-11-28 NOTE — Progress Notes (Signed)
Pt presented to office for BP check per Dr. Gilford Rile, see mychart encounters. BP 146/88, HR 75

## 2013-11-28 NOTE — Telephone Encounter (Signed)
Pt presented to office for BP check per Dr. Gilford Rile, see mychart encounters. BP 146/88, HR 75

## 2013-11-28 NOTE — Telephone Encounter (Signed)
Please ask her to increase Metoprolol to 100mg  daily and return for visit next week to recheck BP.

## 2013-11-29 NOTE — Telephone Encounter (Signed)
Notified pt. Pt states that she took it this morning and it was 130/80.  Pt will increase medication and come in next week for a BP check

## 2013-12-06 ENCOUNTER — Encounter: Payer: Self-pay | Admitting: Internal Medicine

## 2013-12-06 MED ORDER — METOPROLOL SUCCINATE ER 100 MG PO TB24: 100.0000 mg | ORAL_TABLET | Freq: Every day | ORAL | Status: DC

## 2014-03-04 ENCOUNTER — Encounter: Payer: Self-pay | Admitting: Internal Medicine

## 2014-03-04 ENCOUNTER — Ambulatory Visit (INDEPENDENT_AMBULATORY_CARE_PROVIDER_SITE_OTHER): Payer: PRIVATE HEALTH INSURANCE | Admitting: Internal Medicine

## 2014-03-04 VITALS — BP 133/84 | HR 66 | Temp 97.9°F | Ht 62.0 in | Wt 165.0 lb

## 2014-03-04 DIAGNOSIS — R252 Cramp and spasm: Secondary | ICD-10-CM

## 2014-03-04 DIAGNOSIS — I10 Essential (primary) hypertension: Secondary | ICD-10-CM

## 2014-03-04 LAB — COMPREHENSIVE METABOLIC PANEL
ALT: 14 U/L (ref 0–35)
AST: 15 U/L (ref 0–37)
Albumin: 3.9 g/dL (ref 3.5–5.2)
Alkaline Phosphatase: 67 U/L (ref 39–117)
BUN: 18 mg/dL (ref 6–23)
CALCIUM: 9.1 mg/dL (ref 8.4–10.5)
CHLORIDE: 103 meq/L (ref 96–112)
CO2: 29 mEq/L (ref 19–32)
CREATININE: 0.7 mg/dL (ref 0.4–1.2)
GFR: 90.85 mL/min (ref >60.00)
GLUCOSE: 96 mg/dL (ref 70–99)
POTASSIUM: 4.2 meq/L (ref 3.5–5.1)
Sodium: 140 mEq/L (ref 135–145)
Total Bilirubin: 0.6 mg/dL (ref 0.2–1.2)
Total Protein: 7.1 g/dL (ref 6.0–8.3)

## 2014-03-04 LAB — MAGNESIUM: MAGNESIUM: 2 mg/dL (ref 1.5–2.5)

## 2014-03-04 NOTE — Patient Instructions (Signed)
Labs today

## 2014-03-04 NOTE — Assessment & Plan Note (Signed)
BP Readings from Last 3 Encounters:  03/04/14 133/84  11/28/13 146/88  08/29/13 128/98   BP well controlled generally on current medication. Will continue. Renal function with labs today.

## 2014-03-04 NOTE — Progress Notes (Signed)
Pre visit review using our clinic review tool, if applicable. No additional management support is needed unless otherwise documented below in the visit note. 

## 2014-03-04 NOTE — Assessment & Plan Note (Signed)
Intermittent left leg cramping. We discussed that HCTZ may lead to hypokalemia or hypomagnesemia causing cramps. Will check electrolytes with labs today.

## 2014-03-04 NOTE — Progress Notes (Signed)
   Subjective:    Patient ID: Kristy Vega, female    DOB: [birth year], 52 y.o.   MRN: [id number]  HPI [name] presents for follow up.  BP well controlled on current medications when checked at work, all less than 140/90.  Left leg cramps at night intermittently. No swelling in legs. This has only occurred on a couple of occasions. Symptoms resolve after a few min without intervention.  Review of Systems  Constitutional: Negative for fever, chills, appetite change, fatigue and unexpected weight change.  Eyes: Negative for visual disturbance.  Respiratory: Negative for shortness of breath.   Cardiovascular: Negative for chest pain and leg swelling.  Gastrointestinal: Negative for abdominal pain.  Musculoskeletal: Positive for myalgias. Negative for arthralgias and gait problem.  Skin: Negative for color change and rash.  Hematological: Negative for adenopathy. Does not bruise/bleed easily.  Psychiatric/Behavioral: Negative for sleep disturbance and dysphoric mood. The patient is not nervous/anxious.        Objective:    BP 133/84 mmHg  Pulse 66  Temp(Src) 97.9 F (36.6 C) (Oral)  Ht $R'5\' 2"'Rd$  (1.575 m)  Wt 165 lb (74.844 kg)  BMI 30.17 kg/m2  SpO2 95% Physical Exam  Constitutional: She is oriented to person, place, and time. She appears well-developed and well-nourished. No distress.  HENT:  Head: Normocephalic and atraumatic.  Right Ear: External ear normal.  Left Ear: External ear normal.  Nose: Nose normal.  Mouth/Throat: Oropharynx is clear and moist. No oropharyngeal exudate.  Eyes: Conjunctivae are normal. Pupils are equal, round, and reactive to light. Right eye exhibits no discharge. Left eye exhibits no discharge. No scleral icterus.  Neck: Normal range of motion. Neck supple. No tracheal deviation present. No thyromegaly present.  Cardiovascular: Normal rate, regular rhythm, normal heart sounds and intact distal pulses.  Exam reveals no gallop and no friction rub.     No murmur heard. Pulmonary/Chest: Effort normal and breath sounds normal. No accessory muscle usage. No tachypnea. No respiratory distress. She has no decreased breath sounds. She has no wheezes. She has no rhonchi. She has no rales. She exhibits no tenderness.  Musculoskeletal: Normal range of motion. She exhibits no edema or tenderness.  Lymphadenopathy:    She has no cervical adenopathy.  Neurological: She is alert and oriented to person, place, and time. No cranial nerve deficit. She exhibits normal muscle tone. Coordination normal.  Skin: Skin is warm and dry. No rash noted. She is not diaphoretic. No erythema. No pallor.  Psychiatric: She has a normal mood and affect. Her behavior is normal. Judgment and thought content normal.          Assessment & Plan:   Problem List Items Addressed This Visit      Unprioritized   Cramps of left lower extremity - Primary    Intermittent left leg cramping. We discussed that HCTZ may lead to hypokalemia or hypomagnesemia causing cramps. Will check electrolytes with labs today.    Relevant Orders      Comp Met (CMET)      Magnesium   Essential hypertension, benign    BP Readings from Last 3 Encounters:  03/04/14 133/84  11/28/13 146/88  08/29/13 128/98   BP well controlled generally on current medication. Will continue. Renal function with labs today.        Return in about 6 months (around 09/02/2014) for Physical.

## 2014-03-05 ENCOUNTER — Telehealth: Payer: Self-pay | Admitting: Internal Medicine

## 2014-03-05 NOTE — Telephone Encounter (Signed)
emmi emailed °

## 2014-04-08 ENCOUNTER — Other Ambulatory Visit: Payer: Self-pay | Admitting: Internal Medicine

## 2014-04-18 ENCOUNTER — Encounter: Payer: Self-pay | Admitting: *Deleted

## 2014-04-18 NOTE — Telephone Encounter (Signed)
From: Darlin Drop Sent: 01/23/2014 9:07 AM EDT To: Aviva Kluver Subject: RE:Flu Shot Clinic Saturday, 01/26/14 [place]  I get my flu shot at [employer] where I work so you do not need to continue reminding me... But thanks!  ----- Message ----- From: Genia Plants Sent: 01/21/2014 8:44 AM EDT To: Darlin Drop Subject: Flu Shot Clinic Saturday, 01/26/14 [facility] will hold a [employer] on Saturday, October 17 from 9 am to noon. In order to plan appropriately, we ask you to please call the office to schedule an appointment time during the clinic. Our schedulers are ready to assist you, Monday through Friday, 8a to 5p at [phone]. Thank you for choosing [facility] for your healthcare needs.

## 2014-05-08 ENCOUNTER — Other Ambulatory Visit: Payer: Self-pay | Admitting: Internal Medicine

## 2014-07-11 ENCOUNTER — Other Ambulatory Visit: Payer: Self-pay | Admitting: Internal Medicine

## 2014-08-08 ENCOUNTER — Other Ambulatory Visit: Payer: Self-pay | Admitting: Internal Medicine

## 2014-08-09 NOTE — Telephone Encounter (Signed)
Appt 09/02/14

## 2014-08-19 ENCOUNTER — Other Ambulatory Visit: Payer: Self-pay

## 2014-08-19 DIAGNOSIS — Z1231 Encounter for screening mammogram for malignant neoplasm of breast: Secondary | ICD-10-CM

## 2014-09-02 ENCOUNTER — Ambulatory Visit (INDEPENDENT_AMBULATORY_CARE_PROVIDER_SITE_OTHER): Payer: PRIVATE HEALTH INSURANCE | Admitting: Internal Medicine

## 2014-09-02 ENCOUNTER — Ambulatory Visit
Admission: RE | Admit: 2014-09-02 | Discharge: 2014-09-02 | Disposition: A | Discharge status: Home / Self Care | Payer: PRIVATE HEALTH INSURANCE | Source: Ambulatory Visit | Attending: Internal Medicine | Admitting: Internal Medicine

## 2014-09-02 ENCOUNTER — Encounter: Payer: Self-pay | Admitting: Internal Medicine

## 2014-09-02 VITALS — BP 138/83 | HR 67 | Temp 98.0°F | Ht 62.0 in | Wt 171.2 lb

## 2014-09-02 DIAGNOSIS — I10 Essential (primary) hypertension: Secondary | ICD-10-CM

## 2014-09-02 DIAGNOSIS — Z1231 Encounter for screening mammogram for malignant neoplasm of breast: Secondary | ICD-10-CM | POA: Diagnosis present

## 2014-09-02 LAB — LIPID PANEL
Cholesterol: 193 mg/dL (ref 0–200)
HDL: 62.7 mg/dL (ref >39.00)
LDL Cholesterol: 109 mg/dL — ABNORMAL HIGH (ref 0–99)
NONHDL: 130.3
Total CHOL/HDL Ratio: 3
Triglycerides: 105 mg/dL (ref 0.0–149.0)
VLDL: 21 mg/dL (ref 0.0–40.0)

## 2014-09-02 LAB — MICROALBUMIN / CREATININE URINE RATIO
CREATININE, U: 52.1 mg/dL
Microalb Creat Ratio: 1.3 mg/g (ref 0.0–30.0)
Microalb, Ur: <0.7 mg/dL (ref 0.0–1.9)

## 2014-09-02 LAB — COMPREHENSIVE METABOLIC PANEL
ALBUMIN: 4 g/dL (ref 3.5–5.2)
ALT: 10 U/L (ref 0–35)
AST: 15 U/L (ref 0–37)
Alkaline Phosphatase: 77 U/L (ref 39–117)
BUN: 13 mg/dL (ref 6–23)
CALCIUM: 9.6 mg/dL (ref 8.4–10.5)
CO2: 30 mEq/L (ref 19–32)
Chloride: 103 mEq/L (ref 96–112)
Creatinine, Ser: 0.69 mg/dL (ref 0.40–1.20)
GFR: 93.72 mL/min (ref >60.00)
GLUCOSE: 97 mg/dL (ref 70–99)
Potassium: 3.9 mEq/L (ref 3.5–5.1)
Sodium: 139 mEq/L (ref 135–145)
TOTAL PROTEIN: 7.2 g/dL (ref 6.0–8.3)
Total Bilirubin: 0.5 mg/dL (ref 0.2–1.2)

## 2014-09-02 MED ORDER — HYDROCHLOROTHIAZIDE 12.5 MG PO CAPS: 12.5000 mg | ORAL_CAPSULE | Freq: Every day | ORAL | Status: DC

## 2014-09-02 MED ORDER — METOPROLOL SUCCINATE ER 100 MG PO TB24: 100.0000 mg | ORAL_TABLET | Freq: Every day | ORAL | Status: DC

## 2014-09-02 NOTE — Progress Notes (Signed)
Pre visit review using our clinic review tool, if applicable. No additional management support is needed unless otherwise documented below in the visit note. 

## 2014-09-02 NOTE — Patient Instructions (Signed)
Labs today.  Follow up in 6 months or sooner as needed. 

## 2014-09-02 NOTE — Progress Notes (Signed)
Subjective:    Patient ID: Kristy Vega, female    DOB: 07/12/1960, 54 y.o.   MRN: [id number]  HPI  54YO female presents for follow up.  HTN - Compliant with meds. No CP, HA.  No new concerns today. Trying to follow a healthy diet. Not currently exercising. Limited by responsibilities with her father.  Wt Readings from Last 3 Encounters:  09/02/14 171 lb 4 oz (77.678 kg)  03/04/14 165 lb (74.844 kg)  08/29/13 162 lb 12 oz (73.823 kg)     Past medical, surgical, family and social history per today's encounter.  Review of Systems  Constitutional: Negative for fever, chills, appetite change, fatigue and unexpected weight change.  Eyes: Negative for visual disturbance.  Respiratory: Negative for shortness of breath.   Cardiovascular: Negative for chest pain, palpitations and leg swelling.  Gastrointestinal: Negative for nausea, vomiting, abdominal pain, diarrhea and constipation.  Musculoskeletal: Negative for myalgias and arthralgias.  Skin: Negative for color change and rash.  Hematological: Negative for adenopathy. Does not bruise/bleed easily.  Psychiatric/Behavioral: Negative for sleep disturbance and dysphoric mood. The patient is not nervous/anxious.        Objective:    BP 138/83 mmHg  Pulse 67  Temp(Src) 98 F (36.7 C) (Oral)  Ht 5\' 2"  (1.575 m)  Wt 171 lb 4 oz (77.678 kg)  BMI 31.31 kg/m2  SpO2 95% Physical Exam  Constitutional: She is oriented to person, place, and time. She appears well-developed and well-nourished. No distress.  HENT:  Head: Normocephalic and atraumatic.  Right Ear: External ear normal.  Left Ear: External ear normal.  Nose: Nose normal.  Mouth/Throat: Oropharynx is clear and moist. No oropharyngeal exudate.  Eyes: Conjunctivae are normal. Pupils are equal, round, and reactive to light. Right eye exhibits no discharge. Left eye exhibits no discharge. No scleral icterus.  Neck: Normal range of motion. Neck supple. No tracheal deviation  present. No thyromegaly present.  Cardiovascular: Normal rate, regular rhythm, normal heart sounds and intact distal pulses.  Exam reveals no gallop and no friction rub.   No murmur heard. Pulmonary/Chest: Effort normal and breath sounds normal. No accessory muscle usage. No respiratory distress. She has no decreased breath sounds. She has no wheezes. She has no rhonchi. She has no rales. She exhibits no tenderness.  Musculoskeletal: Normal range of motion. She exhibits no edema or tenderness.  Lymphadenopathy:    She has no cervical adenopathy.  Neurological: She is alert and oriented to person, place, and time. No cranial nerve deficit. She exhibits normal muscle tone. Coordination normal.  Skin: Skin is warm and dry. No rash noted. She is not diaphoretic. No erythema. No pallor.  Psychiatric: She has a normal mood and affect. Her behavior is normal. Judgment and thought content normal.          Assessment & Plan:   Problem List Items Addressed This Visit      Unprioritized   Essential hypertension, benign - Primary    BP Readings from Last 3 Encounters:  09/02/14 138/83  03/04/14 133/84  11/28/13 146/88   BP well controlled generally. Will continue current medications. Renal function with labs.      Relevant Medications   metoprolol succinate (TOPROL-XL) 100 MG 24 hr tablet   hydrochlorothiazide (MICROZIDE) 12.5 MG capsule   Other Relevant Orders   Comprehensive metabolic panel   Lipid panel   Microalbumin / creatinine urine ratio       Return in about 6 months (around 03/05/2015) for  Physical.

## 2014-09-02 NOTE — Assessment & Plan Note (Signed)
BP Readings from Last 3 Encounters:  09/02/14 138/83  03/04/14 133/84  11/28/13 146/88   BP well controlled generally. Will continue current medications. Renal function with labs.

## 2014-09-24 ENCOUNTER — Other Ambulatory Visit: Payer: Self-pay | Admitting: Internal Medicine

## 2014-09-24 DIAGNOSIS — Z1231 Encounter for screening mammogram for malignant neoplasm of breast: Secondary | ICD-10-CM

## 2014-10-08 LAB — HM MAMMOGRAPHY

## 2015-02-27 ENCOUNTER — Telehealth: Payer: Self-pay | Admitting: *Deleted

## 2015-03-13 ENCOUNTER — Other Ambulatory Visit: Payer: Self-pay | Admitting: Orthopedic Surgery

## 2015-03-13 ENCOUNTER — Ambulatory Visit
Admission: RE | Admit: 2015-03-13 | Discharge: 2015-03-13 | Disposition: A | Discharge status: Home / Self Care | Payer: PRIVATE HEALTH INSURANCE | Source: Ambulatory Visit | Attending: Orthopedic Surgery | Admitting: Orthopedic Surgery

## 2015-03-13 DIAGNOSIS — M5430 Sciatica, unspecified side: Secondary | ICD-10-CM | POA: Diagnosis present

## 2015-03-13 DIAGNOSIS — M25552 Pain in left hip: Secondary | ICD-10-CM | POA: Insufficient documentation

## 2015-03-13 DIAGNOSIS — R52 Pain, unspecified: Secondary | ICD-10-CM

## 2015-03-28 ENCOUNTER — Encounter: Payer: Self-pay | Admitting: Internal Medicine

## 2015-04-09 ENCOUNTER — Encounter: Payer: Self-pay | Admitting: Internal Medicine

## 2015-04-15 ENCOUNTER — Encounter: Payer: PRIVATE HEALTH INSURANCE | Admitting: Internal Medicine

## 2015-04-21 ENCOUNTER — Encounter: Payer: Self-pay | Admitting: Internal Medicine

## 2015-04-22 ENCOUNTER — Encounter: Payer: PRIVATE HEALTH INSURANCE | Admitting: Internal Medicine

## 2015-05-06 ENCOUNTER — Encounter: Payer: Self-pay | Admitting: Internal Medicine

## 2015-05-06 ENCOUNTER — Ambulatory Visit (INDEPENDENT_AMBULATORY_CARE_PROVIDER_SITE_OTHER): Payer: PRIVATE HEALTH INSURANCE | Admitting: Internal Medicine

## 2015-05-06 VITALS — BP 131/75 | HR 70 | Temp 97.8°F | Ht 61.5 in | Wt 171.5 lb

## 2015-05-06 DIAGNOSIS — Z0000 Encounter for general adult medical examination without abnormal findings: Secondary | ICD-10-CM

## 2015-05-06 LAB — LIPID PANEL
CHOL/HDL RATIO: 2
Cholesterol: 183 mg/dL (ref 0–200)
HDL: 73.8 mg/dL (ref >39.00)
LDL Cholesterol: 82 mg/dL (ref 0–99)
NONHDL: 108.87
TRIGLYCERIDES: 136 mg/dL (ref 0.0–149.0)
VLDL: 27.2 mg/dL (ref 0.0–40.0)

## 2015-05-06 LAB — CBC WITH DIFFERENTIAL/PLATELET
BASOS ABS: 0 10*3/uL (ref 0.0–0.1)
Basophils Relative: 0.4 % (ref 0.0–3.0)
Eosinophils Absolute: 0.2 10*3/uL (ref 0.0–0.7)
Eosinophils Relative: 2.7 % (ref 0.0–5.0)
HEMATOCRIT: 43.5 % (ref 36.0–46.0)
Hemoglobin: 14.7 g/dL (ref 12.0–15.0)
Lymphocytes Relative: 40.5 % (ref 12.0–46.0)
Lymphs Abs: 2.3 10*3/uL (ref 0.7–4.0)
MCHC: 33.8 g/dL (ref 30.0–36.0)
MCV: 95.4 fl (ref 78.0–100.0)
Monocytes Absolute: 0.4 10*3/uL (ref 0.1–1.0)
Monocytes Relative: 7.5 % (ref 3.0–12.0)
NEUTROS ABS: 2.7 10*3/uL (ref 1.4–7.7)
NEUTROS PCT: 48.9 % (ref 43.0–77.0)
PLATELETS: 269 10*3/uL (ref 150.0–400.0)
RBC: 4.56 Mil/uL (ref 3.87–5.11)
RDW: 12.6 % (ref 11.5–15.5)
WBC: 5.6 10*3/uL (ref 4.0–10.5)

## 2015-05-06 LAB — COMPREHENSIVE METABOLIC PANEL
ALT: 12 U/L (ref 0–35)
AST: 17 U/L (ref 0–37)
Albumin: 4 g/dL (ref 3.5–5.2)
Alkaline Phosphatase: 72 U/L (ref 39–117)
BUN: 10 mg/dL (ref 6–23)
CALCIUM: 9 mg/dL (ref 8.4–10.5)
CHLORIDE: 102 meq/L (ref 96–112)
CO2: 30 mEq/L (ref 19–32)
Creatinine, Ser: 0.7 mg/dL (ref 0.40–1.20)
GFR: 91.96 mL/min (ref >60.00)
GLUCOSE: 74 mg/dL (ref 70–99)
Potassium: 4.3 mEq/L (ref 3.5–5.1)
SODIUM: 139 meq/L (ref 135–145)
Total Bilirubin: 0.4 mg/dL (ref 0.2–1.2)
Total Protein: 7.3 g/dL (ref 6.0–8.3)

## 2015-05-06 LAB — TSH: TSH: 2.05 u[IU]/mL (ref 0.35–4.50)

## 2015-05-06 LAB — HEMOGLOBIN A1C: Hgb A1c MFr Bld: 5.6 % (ref 4.6–6.5)

## 2015-05-06 MED ORDER — HYDROCHLOROTHIAZIDE 12.5 MG PO CAPS: 12.5000 mg | ORAL_CAPSULE | Freq: Every day | ORAL | Status: DC

## 2015-05-06 MED ORDER — METOPROLOL SUCCINATE ER 100 MG PO TB24: 100.0000 mg | ORAL_TABLET | Freq: Every day | ORAL | Status: DC

## 2015-05-06 NOTE — Progress Notes (Signed)
Subjective:    Patient ID: Kristy Vega, female    DOB: Sep 02, 1960, 55 y.o.   MRN: [id number]  HPI  55YO female presents for physical exam.  Feeling well. Recently had injury with sewing needle in finger. This was removed.   Wt Readings from Last 3 Encounters:  05/06/15 171 lb 8 oz (77.792 kg)  09/02/14 171 lb 4 oz (77.678 kg)  03/04/14 165 lb (74.844 kg)   BP Readings from Last 3 Encounters:  05/06/15 131/75  09/02/14 138/83  03/04/14 133/84    Past Medical History  Diagnosis Date  . Vaginal delivery     X 2   Family History  Problem Relation Age of Onset  . Hypertension Mother   . Cancer Father     prostate  . Stroke Maternal Grandfather    Past Surgical History  Procedure Laterality Date  . Cholecystectomy    . Abdominal hysterectomy      Dr Josefa Half, for menorrhagia  . Incision / drainage hand / finger      removal of sewing needle   Social History   Social History  . Marital Status: Married    Spouse Name: N/A  . Number of Children: N/A  . Years of Education: N/A   Social History Main Topics  . Smoking status: Never Smoker   . Smokeless tobacco: None  . Alcohol Use: 0.0 oz/week    0 Standard drinks or equivalent per week     Comment: occasional  . Drug Use: None  . Sexual Activity: Not Asked   Other Topics Concern  . None   Social History Narrative   Exercises regularly      Animals: Dogs and horses    Review of Systems  Constitutional: Negative for fever, chills, appetite change, fatigue and unexpected weight change.  Eyes: Negative for visual disturbance.  Respiratory: Negative for cough and shortness of breath.   Cardiovascular: Negative for chest pain and leg swelling.  Gastrointestinal: Negative for nausea, vomiting, abdominal pain, diarrhea and constipation.  Musculoskeletal: Negative for myalgias and arthralgias.  Skin: Negative for color change and rash.  Hematological: Negative for adenopathy. Does not bruise/bleed  easily.  Psychiatric/Behavioral: Negative for sleep disturbance and dysphoric mood. The patient is not nervous/anxious.        Objective:    BP 131/75 mmHg  Pulse 70  Temp(Src) 97.8 F (36.6 C) (Oral)  Ht 5' 1.5" (1.562 m)  Wt 171 lb 8 oz (77.792 kg)  BMI 31.88 kg/m2  SpO2 95% Physical Exam  Constitutional: She is oriented to person, place, and time. She appears well-developed and well-nourished. No distress.  HENT:  Head: Normocephalic and atraumatic.  Right Ear: External ear normal.  Left Ear: External ear normal.  Nose: Nose normal.  Mouth/Throat: Oropharynx is clear and moist. No oropharyngeal exudate.  Eyes: Conjunctivae are normal. Pupils are equal, round, and reactive to light. Right eye exhibits no discharge. Left eye exhibits no discharge. No scleral icterus.  Neck: Normal range of motion. Neck supple. No tracheal deviation present. No thyromegaly present.  Cardiovascular: Normal rate, regular rhythm, normal heart sounds and intact distal pulses.  Exam reveals no gallop and no friction rub.   No murmur heard. Pulmonary/Chest: Effort normal and breath sounds normal. No accessory muscle usage. No tachypnea. No respiratory distress. She has no decreased breath sounds. She has no wheezes. She has no rales. She exhibits no tenderness. Right breast exhibits no inverted nipple, no mass, no nipple discharge, no skin  change and no tenderness. Left breast exhibits no inverted nipple, no mass, no nipple discharge, no skin change and no tenderness. Breasts are symmetrical.  Abdominal: Soft. Bowel sounds are normal. She exhibits no distension and no mass. There is no tenderness. There is no rebound and no guarding.  Musculoskeletal: Normal range of motion. She exhibits no edema or tenderness.  Lymphadenopathy:    She has no cervical adenopathy.  Neurological: She is alert and oriented to person, place, and time. No cranial nerve deficit. She exhibits normal muscle tone. Coordination  normal.  Skin: Skin is warm and dry. No rash noted. She is not diaphoretic. No erythema. No pallor.  Psychiatric: She has a normal mood and affect. Her behavior is normal. Judgment and thought content normal.          Assessment & Plan:   Problem List Items Addressed This Visit      Unprioritized   Routine general medical examination at a health care facility - Primary    General medical exam normal today including breast exam. PAP UTD normal 2014, HPV neg. Plan repeat in 2019. Labs today. Mammogram in 08/2015. Colonoscopy UTD. Encouraged healthy diet and exercise. Immunizations UTD. Follow up 6 months and prn.      Relevant Orders   TSH   Hemoglobin A1c   CBC with Differential/Platelet   Comprehensive metabolic panel   Lipid panel       Return in about 6 months (around 11/03/2015) for Recheck.

## 2015-05-06 NOTE — Patient Instructions (Signed)
Health Maintenance, Female Adopting a healthy lifestyle and getting preventive care can go a long way to promote health and wellness. Talk with your health care provider about what schedule of regular examinations is right for you. This is a good chance for you to check in with your provider about disease prevention and staying healthy. In between checkups, there are plenty of things you can do on your own. Experts have done a lot of research about which lifestyle changes and preventive measures are most likely to keep you healthy. Ask your health care provider for more information. WEIGHT AND DIET  Eat a healthy diet  Be sure to include plenty of vegetables, fruits, low-fat dairy products, and lean protein.  Do not eat a lot of foods high in solid fats, added sugars, or salt.  Get regular exercise. This is one of the most important things you can do for your health.  Most adults should exercise for at least 150 minutes each week. The exercise should increase your heart rate and make you sweat (moderate-intensity exercise).  Most adults should also do strengthening exercises at least twice a week. This is in addition to the moderate-intensity exercise.  Maintain a healthy weight  Body mass index (BMI) is a measurement that can be used to identify possible weight problems. It estimates body fat based on height and weight. Your health care provider can help determine your BMI and help you achieve or maintain a healthy weight.  For females 20 years of age and older:   A BMI below 18.5 is considered underweight.  A BMI of 18.5 to 24.9 is normal.  A BMI of 25 to 29.9 is considered overweight.  A BMI of 30 and above is considered obese.  Watch levels of cholesterol and blood lipids  You should start having your blood tested for lipids and cholesterol at 57 years of age, then have this test every 5 years.  You may need to have your cholesterol levels checked more often if:  Your lipid  or cholesterol levels are high.  You are older than 57 years of age.  You are at high risk for heart disease.  CANCER SCREENING   Lung Cancer  Lung cancer screening is recommended for adults 55-80 years old who are at high risk for lung cancer because of a history of smoking.  A yearly low-dose CT scan of the lungs is recommended for people who:  Currently smoke.  Have quit within the past 15 years.  Have at least a 30-pack-year history of smoking. A pack year is smoking an average of one pack of cigarettes a day for 1 year.  Yearly screening should continue until it has been 15 years since you quit.  Yearly screening should stop if you develop a health problem that would prevent you from having lung cancer treatment.  Breast Cancer  Practice breast self-awareness. This means understanding how your breasts normally appear and feel.  It also means doing regular breast self-exams. Let your health care provider know about any changes, no matter how small.  If you are in your 20s or 30s, you should have a clinical breast exam (CBE) by a health care provider every 1-3 years as part of a regular health exam.  If you are 40 or older, have a CBE every year. Also consider having a breast X-ray (mammogram) every year.  If you have a family history of breast cancer, talk to your health care provider about genetic screening.  If you   are at high risk for breast cancer, talk to your health care provider about having an MRI and a mammogram every year.  Breast cancer gene (BRCA) assessment is recommended for women who have family members with BRCA-related cancers. BRCA-related cancers include:  Breast.  Ovarian.  Tubal.  Peritoneal cancers.  Results of the assessment will determine the need for genetic counseling and BRCA1 and BRCA2 testing. Cervical Cancer Your health care provider may recommend that you be screened regularly for cancer of the pelvic organs (ovaries, uterus, and  vagina). This screening involves a pelvic examination, including checking for microscopic changes to the surface of your cervix (Pap test). You may be encouraged to have this screening done every 3 years, beginning at age 21.  For women ages 30-65, health care providers may recommend pelvic exams and Pap testing every 3 years, or they may recommend the Pap and pelvic exam, combined with testing for human papilloma virus (HPV), every 5 years. Some types of HPV increase your risk of cervical cancer. Testing for HPV may also be done on women of any age with unclear Pap test results.  Other health care providers may not recommend any screening for nonpregnant women who are considered low risk for pelvic cancer and who do not have symptoms. Ask your health care provider if a screening pelvic exam is right for you.  If you have had past treatment for cervical cancer or a condition that could lead to cancer, you need Pap tests and screening for cancer for at least 20 years after your treatment. If Pap tests have been discontinued, your risk factors (such as having a new sexual partner) need to be reassessed to determine if screening should resume. Some women have medical problems that increase the chance of getting cervical cancer. In these cases, your health care provider may recommend more frequent screening and Pap tests. Colorectal Cancer  This type of cancer can be detected and often prevented.  Routine colorectal cancer screening usually begins at 57 years of age and continues through 57 years of age.  Your health care provider may recommend screening at an earlier age if you have risk factors for colon cancer.  Your health care provider may also recommend using home test kits to check for hidden blood in the stool.  A small camera at the end of a tube can be used to examine your colon directly (sigmoidoscopy or colonoscopy). This is done to check for the earliest forms of colorectal  cancer.  Routine screening usually begins at age 50.  Direct examination of the colon should be repeated every 5-10 years through 57 years of age. However, you may need to be screened more often if early forms of precancerous polyps or small growths are found. Skin Cancer  Check your skin from head to toe regularly.  Tell your health care provider about any new moles or changes in moles, especially if there is a change in a mole's shape or color.  Also tell your health care provider if you have a mole that is larger than the size of a pencil eraser.  Always use sunscreen. Apply sunscreen liberally and repeatedly throughout the day.  Protect yourself by wearing long sleeves, pants, a wide-brimmed hat, and sunglasses whenever you are outside. HEART DISEASE, DIABETES, AND HIGH BLOOD PRESSURE   High blood pressure causes heart disease and increases the risk of stroke. High blood pressure is more likely to develop in:  People who have blood pressure in the high end   of the normal range (130-139/85-89 mm Hg).  People who are overweight or obese.  People who are African American.  If you are 38-23 years of age, have your blood pressure checked every 3-5 years. If you are 61 years of age or older, have your blood pressure checked every year. You should have your blood pressure measured twice--once when you are at a hospital or clinic, and once when you are not at a hospital or clinic. Record the average of the two measurements. To check your blood pressure when you are not at a hospital or clinic, you can use:  An automated blood pressure machine at a pharmacy.  A home blood pressure monitor.  If you are between 45 years and 39 years old, ask your health care provider if you should take aspirin to prevent strokes.  Have regular diabetes screenings. This involves taking a blood sample to check your fasting blood sugar level.  If you are at a normal weight and have a low risk for diabetes,  have this test once every three years after 57 years of age.  If you are overweight and have a high risk for diabetes, consider being tested at a younger age or more often. PREVENTING INFECTION  Hepatitis B  If you have a higher risk for hepatitis B, you should be screened for this virus. You are considered at high risk for hepatitis B if:  You were born in a country where hepatitis B is common. Ask your health care provider which countries are considered high risk.  Your parents were born in a high-risk country, and you have not been immunized against hepatitis B (hepatitis B vaccine).  You have HIV or AIDS.  You use needles to inject street drugs.  You live with someone who has hepatitis B.  You have had sex with someone who has hepatitis B.  You get hemodialysis treatment.  You take certain medicines for conditions, including cancer, organ transplantation, and autoimmune conditions. Hepatitis C  Blood testing is recommended for:  Everyone born from [birth year] through 1967.  Anyone with known risk factors for hepatitis C. Sexually transmitted infections (STIs)  You should be screened for sexually transmitted infections (STIs) including gonorrhea and chlamydia if:  You are sexually active and are younger than 57 years of age.  You are older than 57 years of age and your health care provider tells you that you are at risk for this type of infection.  Your sexual activity has changed since you were last screened and you are at an increased risk for chlamydia or gonorrhea. Ask your health care provider if you are at risk.  If you do not have HIV, but are at risk, it may be recommended that you take a prescription medicine daily to prevent HIV infection. This is called pre-exposure prophylaxis (PrEP). You are considered at risk if:  You are sexually active and do not regularly use condoms or know the HIV status of your partner(s).  You take drugs by injection.  You are sexually  active with a partner who has HIV. Talk with your health care provider about whether you are at high risk of being infected with HIV. If you choose to begin PrEP, you should first be tested for HIV. You should then be tested every 3 months for as long as you are taking PrEP.  PREGNANCY   If you are premenopausal and you may become pregnant, ask your health care provider about preconception counseling.  If you may  become pregnant, take 400 to 800 micrograms (mcg) of folic acid every day.  If you want to prevent pregnancy, talk to your health care provider about birth control (contraception). OSTEOPOROSIS AND MENOPAUSE   Osteoporosis is a disease in which the bones lose minerals and strength with aging. This can result in serious bone fractures. Your risk for osteoporosis can be identified using a bone density scan.  If you are 61 years of age or older, or if you are at risk for osteoporosis and fractures, ask your health care provider if you should be screened.  Ask your health care provider whether you should take a calcium or vitamin D supplement to lower your risk for osteoporosis.  Menopause may have certain physical symptoms and risks.  Hormone replacement therapy may reduce some of these symptoms and risks. Talk to your health care provider about whether hormone replacement therapy is right for you.  HOME CARE INSTRUCTIONS   Schedule regular health, dental, and eye exams.  Stay current with your immunizations.   Do not use any tobacco products including cigarettes, chewing tobacco, or electronic cigarettes.  If you are pregnant, do not drink alcohol.  If you are breastfeeding, limit how much and how often you drink alcohol.  Limit alcohol intake to no more than 1 drink per day for nonpregnant women. One drink equals 12 ounces of beer, 5 ounces of wine, or 1 ounces of hard liquor.  Do not use street drugs.  Do not share needles.  Ask your health care provider for help if  you need support or information about quitting drugs.  Tell your health care provider if you often feel depressed.  Tell your health care provider if you have ever been abused or do not feel safe at home.   This information is not intended to replace advice given to you by your health care provider. Make sure you discuss any questions you have with your health care provider.   Document Released: 10/12/2010 Document Revised: 04/19/2014 Document Reviewed: 02/28/2013 Elsevier Interactive Patient Education [employer].

## 2015-05-06 NOTE — Progress Notes (Signed)
Pre visit review using our clinic review tool, if applicable. No additional management support is needed unless otherwise documented below in the visit note. 

## 2015-05-06 NOTE — Assessment & Plan Note (Signed)
General medical exam normal today including breast exam. PAP UTD normal 2014, HPV neg. Plan repeat in 2019. Labs today. Mammogram in 08/2015. Colonoscopy UTD. Encouraged healthy diet and exercise. Immunizations UTD. Follow up 6 months and prn.

## 2015-09-16 ENCOUNTER — Other Ambulatory Visit: Payer: Self-pay | Admitting: Internal Medicine

## 2015-09-16 DIAGNOSIS — Z1231 Encounter for screening mammogram for malignant neoplasm of breast: Secondary | ICD-10-CM

## 2015-10-06 DIAGNOSIS — H5213 Myopia, bilateral: Secondary | ICD-10-CM | POA: Diagnosis not present

## 2015-10-08 ENCOUNTER — Other Ambulatory Visit: Payer: Self-pay | Admitting: Internal Medicine

## 2015-10-08 ENCOUNTER — Ambulatory Visit
Admission: RE | Admit: 2015-10-08 | Discharge: 2015-10-08 | Disposition: A | Discharge status: Home / Self Care | Payer: 59 | Source: Ambulatory Visit | Attending: Internal Medicine | Admitting: Internal Medicine

## 2015-10-08 DIAGNOSIS — Z1231 Encounter for screening mammogram for malignant neoplasm of breast: Secondary | ICD-10-CM | POA: Insufficient documentation

## 2015-10-21 ENCOUNTER — Encounter: Payer: Self-pay | Admitting: Internal Medicine

## 2015-10-30 ENCOUNTER — Ambulatory Visit (INDEPENDENT_AMBULATORY_CARE_PROVIDER_SITE_OTHER): Payer: 59 | Admitting: Internal Medicine

## 2015-10-30 ENCOUNTER — Encounter: Payer: Self-pay | Admitting: Internal Medicine

## 2015-10-30 VITALS — BP 142/88 | HR 69 | Temp 97.9°F | Ht 62.0 in | Wt 172.4 lb

## 2015-10-30 DIAGNOSIS — I10 Essential (primary) hypertension: Secondary | ICD-10-CM | POA: Diagnosis not present

## 2015-10-30 LAB — COMPREHENSIVE METABOLIC PANEL
ALBUMIN: 4.3 g/dL (ref 3.5–5.2)
ALT: 15 U/L (ref 0–35)
AST: 19 U/L (ref 0–37)
Alkaline Phosphatase: 73 U/L (ref 39–117)
BUN: 15 mg/dL (ref 6–23)
CALCIUM: 9.5 mg/dL (ref 8.4–10.5)
CHLORIDE: 100 meq/L (ref 96–112)
CO2: 28 mEq/L (ref 19–32)
CREATININE: 0.65 mg/dL (ref 0.40–1.20)
GFR: 99.99 mL/min (ref >60.00)
Glucose, Bld: 100 mg/dL — ABNORMAL HIGH (ref 70–99)
Potassium: 3.9 mEq/L (ref 3.5–5.1)
Sodium: 138 mEq/L (ref 135–145)
Total Bilirubin: 0.4 mg/dL (ref 0.2–1.2)
Total Protein: 7.9 g/dL (ref 6.0–8.3)

## 2015-10-30 MED ORDER — MELOXICAM 15 MG PO TABS: 15.0000 mg | ORAL_TABLET | Freq: Every day | ORAL | Status: DC | PRN

## 2015-10-30 MED ORDER — HYDROCHLOROTHIAZIDE 12.5 MG PO CAPS: 12.5000 mg | ORAL_CAPSULE | Freq: Every day | ORAL | Status: DC

## 2015-10-30 MED ORDER — METOPROLOL SUCCINATE ER 100 MG PO TB24: 100.0000 mg | ORAL_TABLET | Freq: Every day | ORAL | Status: DC

## 2015-10-30 NOTE — Progress Notes (Signed)
Subjective:    Patient ID: Kristy Vega, female    DOB: 1960-06-15, 55 y.o.   MRN: [id number]  HPI  55YO female presents for follow up.  HTN - Compliant with medications. Feeling well. Some increased stress caring for her mother after the death of her father. Concerned about weight gain and hoping to get back on track with diet.  Wt Readings from Last 3 Encounters:  10/30/15 172 lb 6.4 oz (78.2 kg)  05/06/15 171 lb 8 oz (77.792 kg)  09/02/14 171 lb 4 oz (77.678 kg)   BP Readings from Last 3 Encounters:  10/30/15 142/88  05/06/15 131/75  09/02/14 138/83    Past Medical History  Diagnosis Date  . Vaginal delivery     X 2   Family History  Problem Relation Age of Onset  . Hypertension Mother   . Cancer Father     prostate  . Stroke Maternal Grandfather    Past Surgical History  Procedure Laterality Date  . Cholecystectomy    . Abdominal hysterectomy      Dr Josefa Half, for menorrhagia  . Incision / drainage hand / finger      removal of sewing needle   Social History   Social History  . Marital Status: Married    Spouse Name: N/A  . Number of Children: N/A  . Years of Education: N/A   Social History Main Topics  . Smoking status: Never Smoker   . Smokeless tobacco: None  . Alcohol Use: 0.0 oz/week    0 Standard drinks or equivalent per week     Comment: occasional  . Drug Use: None  . Sexual Activity: Not Asked   Other Topics Concern  . None   Social History Narrative   Exercises regularly      Animals: Dogs and horses    Review of Systems  Constitutional: Negative for fever, chills, appetite change, fatigue and unexpected weight change.  Eyes: Negative for visual disturbance.  Respiratory: Negative for cough and shortness of breath.   Cardiovascular: Negative for chest pain, palpitations and leg swelling.  Gastrointestinal: Negative for nausea, vomiting, abdominal pain, diarrhea and constipation.  Musculoskeletal: Negative for myalgias and  arthralgias.  Skin: Negative for color change and rash.  Hematological: Negative for adenopathy. Does not bruise/bleed easily.  Psychiatric/Behavioral: Negative for suicidal ideas, sleep disturbance and dysphoric mood. The patient is nervous/anxious.        Objective:    BP 142/88 mmHg  Pulse 69  Temp(Src) 97.9 F (36.6 C)  Ht 5' 2"  (1.575 m)  Wt 172 lb 6.4 oz (78.2 kg)  BMI 31.52 kg/m2  SpO2 98% Physical Exam  Constitutional: She is oriented to person, place, and time. She appears well-developed and well-nourished. No distress.  HENT:  Head: Normocephalic and atraumatic.  Right Ear: External ear normal.  Left Ear: External ear normal.  Nose: Nose normal.  Mouth/Throat: Oropharynx is clear and moist. No oropharyngeal exudate.  Eyes: Conjunctivae are normal. Pupils are equal, round, and reactive to light. Right eye exhibits no discharge. Left eye exhibits no discharge. No scleral icterus.  Neck: Normal range of motion. Neck supple. No tracheal deviation present. No thyromegaly present.  Cardiovascular: Normal rate, regular rhythm, normal heart sounds and intact distal pulses.  Exam reveals no gallop and no friction rub.   No murmur heard. Pulmonary/Chest: Effort normal and breath sounds normal. No respiratory distress. She has no wheezes. She has no rales. She exhibits no tenderness.  Musculoskeletal: Normal  range of motion. She exhibits no edema or tenderness.  Lymphadenopathy:    She has no cervical adenopathy.  Neurological: She is alert and oriented to person, place, and time. No cranial nerve deficit. She exhibits normal muscle tone. Coordination normal.  Skin: Skin is warm and dry. No rash noted. She is not diaphoretic. No erythema. No pallor.  Psychiatric: She has a normal mood and affect. Her behavior is normal. Judgment and thought content normal.          Assessment & Plan:   Problem List Items Addressed This Visit      Unprioritized   Essential hypertension,  benign - Primary (Chronic)    BP Readings from Last 3 Encounters:  10/30/15 142/88  05/06/15 131/75  09/02/14 138/83   BP generally well controlled, slightly elevated today. Will check renal function with labs. Continue Metoprolol and HCTZ. Follow up in 3 months.      Relevant Medications   metoprolol succinate (TOPROL-XL) 100 MG 24 hr tablet   hydrochlorothiazide (MICROZIDE) 12.5 MG capsule   Other Relevant Orders   Comp Met (CMET)       Return in about 3 months (around 01/30/2016) for New Patient.  Ronette Deter, MD Internal [facility]

## 2015-10-30 NOTE — Patient Instructions (Signed)
Labs today

## 2015-10-30 NOTE — Assessment & Plan Note (Signed)
BP Readings from Last 3 Encounters:  10/30/15 142/88  05/06/15 131/75  09/02/14 138/83   BP generally well controlled, slightly elevated today. Will check renal function with labs. Continue Metoprolol and HCTZ. Follow up in 3 months.

## 2015-10-30 NOTE — Progress Notes (Signed)
Pre visit review using our clinic review tool, if applicable. No additional management support is needed unless otherwise documented below in the visit note. 

## 2015-10-31 ENCOUNTER — Encounter: Payer: Self-pay | Admitting: Internal Medicine

## 2015-11-05 ENCOUNTER — Ambulatory Visit: Payer: PRIVATE HEALTH INSURANCE | Admitting: Internal Medicine

## 2015-11-13 ENCOUNTER — Encounter: Payer: Self-pay | Admitting: Internal Medicine

## 2015-11-13 MED ORDER — MELOXICAM 15 MG PO TABS
15.0000 mg | ORAL_TABLET | Freq: Every day | ORAL | 3 refills | Status: DC | PRN
Start: 2015-11-13 — End: 2016-11-22

## 2015-12-11 ENCOUNTER — Ambulatory Visit (INDEPENDENT_AMBULATORY_CARE_PROVIDER_SITE_OTHER): Payer: 59 | Admitting: Family

## 2015-12-11 ENCOUNTER — Telehealth: Payer: Self-pay | Admitting: Family

## 2015-12-11 ENCOUNTER — Encounter: Payer: Self-pay | Admitting: Family

## 2015-12-11 VITALS — BP 142/92 | HR 81 | Temp 97.9°F | Wt 171.0 lb

## 2015-12-11 DIAGNOSIS — M25512 Pain in left shoulder: Secondary | ICD-10-CM

## 2015-12-11 LAB — TROPONIN T

## 2015-12-11 NOTE — Telephone Encounter (Signed)
Discussed With patient regarding troponin not back yet and an error on my part regarding wrong lab ordered. She has no chest pain at this time, no shortness of breath. I discussed with her that consulted with 2 other physicians regarding her EKG and I found that to be very reassuring. I advised her if she has any new chest pain or new complaints to get emergency room. Patient verbalized understanding.

## 2015-12-11 NOTE — Patient Instructions (Signed)
My pleasure meeting you. As we discussed, I suspect it is a muscle spasm likely from stress and aggravated by a heavy bag.   Please apply moist heat and continue Tylenol as needed. Gentle stretching.   Low suspicion for cardiac etiology however, and as we discussed today,  Your  EKG and lab work are merely a snapshot in time to reassure [place] that cardiac is less likely. If symptoms worsen or new symptoms present, please get emergency room.   Heart Attack A heart attack (myocardial infarction, MI) causes damage to the heart that cannot be fixed. A heart attack often happens when a blood clot or other blockage cuts blood flow to the heart. When this happens, certain areas of the heart begin to die. This causes the pain you feel during a heart attack. HOME CARE  Take medicine as told by your doctor. You may need medicine to:  Keep your blood from clotting too easily.  Control your blood pressure.  Lower your cholesterol.  Control abnormal heart rhythms.  Change certain behaviors as told by your doctor. This may include:  Quitting smoking.  Being active.  Eating a heart-healthy diet. Ask your doctor for help with this diet.  Keeping a healthy weight.  Keeping your diabetes under control.  Lessening stress.  Limiting how much alcohol you drink. Do not take these medicines unless your doctor says that you can:  Nonsteroidal anti-inflammatory drugs (NSAIDs). These include:  Ibuprofen.  Naproxen.  Celecoxib.  Vitamin supplements that have vitamin A, vitamin E, or both.  Hormone therapy that contains estrogen with or without progestin. GET HELP RIGHT AWAY IF:  You have sudden chest discomfort.  You have sudden discomfort in your:  Arms.  Back.  Neck.  Jaw.  You have shortness of breath at any time.  You have sudden sweating or clammy skin.  You feel sick to your stomach (nauseous) or throw up (vomit).  You suddenly get light-headed or dizzy.  You feel your  heart beating fast or skipping beats. These symptoms may be an emergency. Do not wait to see if the symptoms will go away. Get medical help right away. Call your local emergency services (911 in the [place] Do not drive yourself to the hospital.   This information is not intended to replace advice given to you by your health care provider. Make sure you discuss any questions you have with your health care provider.   Document Released: 09/28/2011 Document Revised: 08/13/2014 Document Reviewed: 06/01/2013 Elsevier Interactive Patient Education [employer].

## 2015-12-11 NOTE — Progress Notes (Signed)
Pre visit review using our clinic review tool, if applicable. No additional management support is needed unless otherwise documented below in the visit note. 

## 2015-12-11 NOTE — Progress Notes (Addendum)
Subjective:    Patient ID: Kristy Vega, female    DOB: 26-Oct-1960, 55 y.o.   MRN: [id number]  CC: Kristy Vega is a 55 y.o. female who presents today for an acute visit.    HPI: Patient here for acute visit for chief complaint of left neck and shoulder area for 2 days. Plans to establish with Kristy Vega in October. Left upper chest and shoulder pain. Worse after carrying heavy shoulder bag. Recent stress of losing father 2 months ago, stepfather 3 weeks ago. She's caring for her aging mother. Woke up two days ago with 'catching sensation in arm and neck' worse with movement. Improved with advil and tylenol. Denies exertional chest pain or pressure, numbness or tingling radiating to left arm or jaw, palpitations, dizziness, frequent headaches, changes in vision, or shortness of breath.              Chart review: She was seen in July for blood pressure follow-up. Per chart review, metabolic panel normal. Physical done January 2017.      HISTORY:  Past Medical History:  Diagnosis Date  . Vaginal delivery    X 2   Past Surgical History:  Procedure Laterality Date  . ABDOMINAL HYSTERECTOMY     Kristy Vega, for menorrhagia  . CHOLECYSTECTOMY    . INCISION / DRAINAGE HAND / FINGER     removal of sewing needle   Family History  Problem Relation Age of Onset  . Hypertension Mother   . Cancer Father     prostate  . Stroke Maternal Grandfather     Allergies: Penicillins Current Outpatient Prescriptions on File Prior to Visit  Medication Sig Dispense Refill  . hydrochlorothiazide (MICROZIDE) 12.5 MG capsule Take 1 capsule (12.5 mg total) by mouth daily. 90 capsule 3  . meloxicam (MOBIC) 15 MG tablet Take 1 tablet (15 mg total) by mouth daily as needed. 90 tablet 3  . metoprolol succinate (TOPROL-XL) 100 MG 24 hr tablet Take 1 tablet (100 mg total) by mouth daily. Take with or immediately following a meal. 90 tablet 3  . Multiple Vitamin (MULTIVITAMIN) tablet Take 1  tablet by mouth daily.     No current facility-administered medications on file prior to visit.     Social History  Substance Use Topics  . Smoking status: Never Smoker  . Smokeless tobacco: Never Used  . Alcohol use 0.0 oz/week     Comment: occasional    Review of Systems  Constitutional: Negative for chills, fatigue and fever.  Respiratory: Negative for cough and shortness of breath.   Cardiovascular: Negative for chest pain and palpitations.  Gastrointestinal: Negative for nausea and vomiting.  Musculoskeletal: Positive for neck pain. Negative for neck stiffness.  Neurological: Negative for dizziness and headaches.      Objective:    BP (!) 142/92   Pulse 81   Temp 97.9 F (36.6 C) (Oral)   Wt 171 lb (77.6 kg)   SpO2 97%   BMI 31.28 kg/m    Physical Exam  Constitutional: She appears well-developed and well-nourished.  Eyes: Conjunctivae are normal.  Cardiovascular: Normal rate, regular rhythm, normal heart sounds and normal pulses.   Pulmonary/Chest: Effort normal and breath sounds normal. She has no wheezes. She has no rhonchi. She has no rales.  Musculoskeletal:       Left shoulder: She exhibits normal range of motion, no tenderness, no bony tenderness, no pain and no spasm.       Cervical back:  Normal. She exhibits normal range of motion, no tenderness, no bony tenderness, no swelling, no edema, no pain and no spasm.       Arms: Neurological: She is alert.  Skin: Skin is warm and dry.  Psychiatric: She has a normal mood and affect. Her speech is normal and behavior is normal. Thought content normal.  Vitals reviewed.      Assessment & Plan:  1. Left shoulder pain I'm very reassured by normal EKG.  Pending troponins. I have low clinical suspicion that patient is having an active MI. The location of the pain corresponds with carrying a very heavy bag and recent stress in her life. I suspect patient has a muscle spasm of trapezius muscle as pain responds well to  Tylenol. Have given her education on heart attack and advised her to remain vigilant. Return precautions given.  NOTE: Consulted Kristy. Esmond Vega, Cardiology regarding T wave inversions seen on EKG. He had no concern as these were similar and seen in on prior EKG, 2014.   - EKG 12-Lead - Troponin T     I am having Kristy Vega maintain her multivitamin, metoprolol succinate, hydrochlorothiazide, and meloxicam.   No orders of the defined types were placed in this encounter.   Return precautions given.   Risks, benefits, and alternatives of the medications and treatment plan prescribed today were discussed, and patient expressed understanding.   Education regarding symptom management and diagnosis given to patient on AVS.  Continue to follow with Kristy Paris, FNP for routine health maintenance.   Kristy Vega and I agreed with plan.   Kristy Paris, FNP

## 2015-12-17 ENCOUNTER — Encounter: Payer: 59 | Attending: Internal Medicine | Admitting: Dietician

## 2015-12-17 ENCOUNTER — Encounter: Payer: Self-pay | Admitting: Family

## 2015-12-17 VITALS — Ht 62.0 in | Wt 172.8 lb

## 2015-12-17 DIAGNOSIS — Z713 Dietary counseling and surveillance: Secondary | ICD-10-CM | POA: Diagnosis not present

## 2015-12-17 DIAGNOSIS — E669 Obesity, unspecified: Secondary | ICD-10-CM

## 2015-12-17 NOTE — Progress Notes (Signed)
Notes from [facility] employee "self referral" nutrition session: Start time: 1330   End [phone]  Met with employee to discuss his/her nutritional concerns and diet history. The employee's questions/concerns were also addressed.  Kristy Vega has followed the [facility] program in the past and lost over 50lbs. She stopped exercising regularly and has drifted away from some of the diet habits she once followed, and has gained some of the weight back.   We discussed the following topics:  Healthy Eating  Exercise  Weight Concerns   I also provided the following handouts as reinforcement of the educational session:  100 ways to cut 100 calories  Quick and Healthy Meal Ideas  Top 10 Healthy Diet Changes  DASH diet guide  Goals/ instructions   Additional Comments: Kristy Vega voices difficulty finding motivation to make healthy changes at this time; she is dealing with less energy and sometimes long work hours. Encouraged her to make a few simple changes at a time, perhaps starting with keeping a food log to increase awareness of her food intake. Also encouraged her to start with manageable changes with exercise such as short duration exercise, or finding a new activity she enjoys to help with motivation.     Goals:   Log food intake either on paper or using an app such as MyFitnessPal or LoseIt -- or the live life well website.   Control food portions, particularly sweets  Continue to increase calorie burning through increased moving/ exercise throughout the day. A few minutes several times a day can be as effective as one longer exercise session.  Encouraged her to call with any questions or concerns. She will obtain MNT referral from her PCP if she would like further RD assistance.

## 2015-12-17 NOTE — Patient Instructions (Signed)
   Log food intake either on paper or using an app such as MyFitnessPal or LoseIt -- or the live life well website.   Control food portions, particularly sweets  Continue to increase calorie burning through increased moving/ exercise throughout the day. A few minutes several times a day can be as effective as one longer exercise session.

## 2016-01-30 ENCOUNTER — Encounter: Payer: Self-pay | Admitting: Internal Medicine

## 2016-01-30 ENCOUNTER — Ambulatory Visit (INDEPENDENT_AMBULATORY_CARE_PROVIDER_SITE_OTHER): Payer: 59 | Admitting: Internal Medicine

## 2016-01-30 VITALS — BP 150/100 | HR 66 | Temp 98.2°F | Ht 62.0 in | Wt 173.8 lb

## 2016-01-30 DIAGNOSIS — J3489 Other specified disorders of nose and nasal sinuses: Secondary | ICD-10-CM | POA: Diagnosis not present

## 2016-01-30 DIAGNOSIS — Z860100 Personal history of colon polyps, unspecified: Secondary | ICD-10-CM

## 2016-01-30 DIAGNOSIS — I10 Essential (primary) hypertension: Secondary | ICD-10-CM

## 2016-01-30 DIAGNOSIS — R739 Hyperglycemia, unspecified: Secondary | ICD-10-CM

## 2016-01-30 MED ORDER — MUPIROCIN 2 % EX OINT: 1.0000 "application " | TOPICAL_OINTMENT | Freq: Two times a day (BID) | CUTANEOUS | 0 refills | Status: DC

## 2016-01-30 MED ORDER — LISINOPRIL 10 MG PO TABS: 10.0000 mg | ORAL_TABLET | Freq: Every day | ORAL | 1 refills | Status: DC

## 2016-01-30 NOTE — Progress Notes (Signed)
Patient ID: Kristy Vega, female   DOB: [birth year], 55 y.o.   MRN: [id number]   Subjective:    Patient ID: Kristy Vega, female    DOB: Jan 21, 1961, 55 y.o.   MRN: [id number]  HPI  Patient here for a scheduled follow up.  She is a new pt to me.  Here to establish care with me.  Works at [employer] office.  She reports she is doing relatively well. Tries to stay active.  No chest pain.  No sob.  No acid reflux.  Previously saw GI.  Had colonoscopy 2012.  Had hyperplastic polyps.  Recommended f/u in 10 years.  She has had issues with right hip sciatica.  Better now.  Is s/p hysterectomy for heavy bleeding.  Has never had abnormal pap smear.  Persistent lesion left nares.     Past Medical History:  Diagnosis Date  . History of colon polyps   . Vaginal delivery    X 2   Past Surgical History:  Procedure Laterality Date  . ABDOMINAL HYSTERECTOMY     Dr Josefa Half, for menorrhagia  . CHOLECYSTECTOMY    . INCISION / DRAINAGE HAND / FINGER     removal of sewing needle   Family History  Problem Relation Age of Onset  . Hypertension Mother   . Cancer Father     prostate  . Stroke Maternal Grandfather    Social History   Social History  . Marital status: Married    Spouse name: N/A  . Number of children: N/A  . Years of education: N/A   Social History Main Topics  . Smoking status: Never Smoker  . Smokeless tobacco: Never Used  . Alcohol use 0.0 oz/week     Comment: occasional  . Drug use: Unknown  . Sexual activity: Not Asked   Other Topics Concern  . None   Social History Narrative   Exercises regularly      Animals: Dogs and horses    Outpatient Encounter Prescriptions as of 01/30/2016  Medication Sig  . hydrochlorothiazide (MICROZIDE) 12.5 MG capsule Take 1 capsule (12.5 mg total) by mouth daily.  . meloxicam (MOBIC) 15 MG tablet Take 1 tablet (15 mg total) by mouth daily as needed.  . metoprolol succinate (TOPROL-XL) 100 MG 24 hr tablet Take 1 tablet (100 mg  total) by mouth daily. Take with or immediately following a meal.  . Multiple Vitamin (MULTIVITAMIN) tablet Take 1 tablet by mouth daily.  Marland Kitchen lisinopril (PRINIVIL,ZESTRIL) 10 MG tablet Take 1 tablet (10 mg total) by mouth daily.  . mupirocin ointment (BACTROBAN) 2 % Place 1 application into the nose 2 (two) times daily.   No facility-administered encounter medications on file as of 01/30/2016.     Review of Systems  Constitutional: Negative for appetite change and unexpected weight change.  HENT: Negative for congestion and sinus pressure.   Respiratory: Negative for cough, chest tightness and shortness of breath.   Cardiovascular: Negative for chest pain, palpitations and leg swelling.  Gastrointestinal: Negative for abdominal pain, diarrhea, nausea and vomiting.  Genitourinary: Negative for difficulty urinating and dysuria.  Musculoskeletal: Negative for joint swelling.       Right hip sciatica.  Better now.   Skin: Negative for color change and rash.  Neurological: Negative for dizziness, light-headedness and headaches.  Psychiatric/Behavioral: Negative for agitation and dysphoric mood.       Objective:     Blood pressure rechecked by me:  142/86  Physical Exam  Constitutional:  She appears well-developed and well-nourished. No distress.  HENT:  Mouth/Throat: Oropharynx is clear and moist.  Lesion - left nares.    Neck: Neck supple. No thyromegaly present.  Cardiovascular: Normal rate and regular rhythm.   Pulmonary/Chest: Breath sounds normal. No respiratory distress. She has no wheezes.  Abdominal: Soft. Bowel sounds are normal. There is no tenderness.  Musculoskeletal: She exhibits no edema or tenderness.  Lymphadenopathy:    She has no cervical adenopathy.  Skin: No rash noted. No erythema.  Psychiatric: She has a normal mood and affect. Her behavior is normal.    BP (!) 150/100   Pulse 66   Temp 98.2 F (36.8 C) (Oral)   Ht 5\' 2"  (1.575 m)   Wt 173 lb 12.8 oz  (78.8 kg)   SpO2 97%   BMI 31.79 kg/m  Wt Readings from Last 3 Encounters:  01/30/16 173 lb 12.8 oz (78.8 kg)  12/17/15 172 lb 12.8 oz (78.4 kg)  12/11/15 171 lb (77.6 kg)     Lab Results  Component Value Date   WBC 5.6 05/06/2015   HGB 14.7 05/06/2015   HCT 43.5 05/06/2015   PLT 269.0 05/06/2015   GLUCOSE 100 (H) 10/30/2015   CHOL 183 05/06/2015   TRIG 136.0 05/06/2015   HDL 73.80 05/06/2015   LDLDIRECT 122.8 07/24/2012   LDLCALC 82 05/06/2015   ALT 15 10/30/2015   AST 19 10/30/2015   NA 138 10/30/2015   K 3.9 10/30/2015   CL 100 10/30/2015   CREATININE 0.65 10/30/2015   BUN 15 10/30/2015   CO2 28 10/30/2015   TSH 2.05 05/06/2015   HGBA1C 5.6 05/06/2015   MICROALBUR <0.7 09/02/2014    Mm Screening Breast Tomo Bilateral  Result Date: 10/08/2015 CLINICAL DATA:  Screening. EXAM: 2D DIGITAL SCREENING BILATERAL MAMMOGRAM WITH CAD AND ADJUNCT TOMO COMPARISON:  Previous exam(s). ACR Breast Density Category b: There are scattered areas of fibroglandular density. FINDINGS: There are no findings suspicious for malignancy. Images were processed with CAD. IMPRESSION: No mammographic evidence of malignancy. A result letter of this screening mammogram will be mailed directly to the patient. RECOMMENDATION: Screening mammogram in one year. (Code:SM-B-01Y) BI-RADS CATEGORY  1: Negative. Electronically Signed   By: Pamelia Hoit M.D.   On: 10/08/2015 09:41       Assessment & Plan:   Problem List Items Addressed This Visit    Essential hypertension, benign (Chronic)    Blood pressure elevated and per her report has been elevated.  On hctz and metoprolol.  Will start lisinopril 10mg  q day.  Follow pressures.  Check metabolic panel in 10 days.        Relevant Medications   lisinopril (PRINIVIL,ZESTRIL) 10 MG tablet   Other Relevant Orders   Lipid panel   Comprehensive metabolic panel   History of colonic polyps    Colonoscopy 2012 - hyperplastic polyps.  Recommended f/u in 10 years  (per pt).        Internal nasal lesion    Persistent.  bactroban ointment as directed.  Follow.        Other Visit Diagnoses    Hyperglycemia    -  Primary   Relevant Orders   Hemoglobin A1c       Einar Pheasant, MD

## 2016-01-30 NOTE — Progress Notes (Signed)
Pre visit review using our clinic review tool, if applicable. No additional management support is needed unless otherwise documented below in the visit note. 

## 2016-01-31 ENCOUNTER — Encounter: Payer: Self-pay | Admitting: Internal Medicine

## 2016-01-31 DIAGNOSIS — J3489 Other specified disorders of nose and nasal sinuses: Secondary | ICD-10-CM | POA: Insufficient documentation

## 2016-01-31 DIAGNOSIS — Z86010 Personal history of colonic polyps: Secondary | ICD-10-CM | POA: Insufficient documentation

## 2016-01-31 NOTE — Assessment & Plan Note (Signed)
Colonoscopy 2012 - hyperplastic polyps.  Recommended f/u in 10 years (per pt).

## 2016-01-31 NOTE — Assessment & Plan Note (Signed)
Blood pressure elevated and per her report has been elevated.  On hctz and metoprolol.  Will start lisinopril 10mg  q day.  Follow pressures.  Check metabolic panel in 10 days.

## 2016-01-31 NOTE — Assessment & Plan Note (Signed)
Persistent.  bactroban ointment as directed.  Follow.

## 2016-02-16 ENCOUNTER — Encounter: Payer: Self-pay | Admitting: Internal Medicine

## 2016-02-16 ENCOUNTER — Other Ambulatory Visit (INDEPENDENT_AMBULATORY_CARE_PROVIDER_SITE_OTHER): Payer: 59

## 2016-02-16 DIAGNOSIS — I10 Essential (primary) hypertension: Secondary | ICD-10-CM | POA: Diagnosis not present

## 2016-02-16 DIAGNOSIS — R739 Hyperglycemia, unspecified: Secondary | ICD-10-CM

## 2016-02-16 LAB — LIPID PANEL
Cholesterol: 194 mg/dL (ref 0–200)
HDL: 67.9 mg/dL (ref >39.00)
LDL Cholesterol: 107 mg/dL — ABNORMAL HIGH (ref 0–99)
NonHDL: 126.14
TRIGLYCERIDES: 98 mg/dL (ref 0.0–149.0)
Total CHOL/HDL Ratio: 3
VLDL: 19.6 mg/dL (ref 0.0–40.0)

## 2016-02-16 LAB — COMPREHENSIVE METABOLIC PANEL
ALT: 12 U/L (ref 0–35)
AST: 14 U/L (ref 0–37)
Albumin: 3.9 g/dL (ref 3.5–5.2)
Alkaline Phosphatase: 70 U/L (ref 39–117)
BILIRUBIN TOTAL: 0.7 mg/dL (ref 0.2–1.2)
BUN: 12 mg/dL (ref 6–23)
CALCIUM: 9.2 mg/dL (ref 8.4–10.5)
CO2: 30 mEq/L (ref 19–32)
Chloride: 102 mEq/L (ref 96–112)
Creatinine, Ser: 0.65 mg/dL (ref 0.40–1.20)
GFR: 99.88 mL/min (ref >60.00)
Glucose, Bld: 102 mg/dL — ABNORMAL HIGH (ref 70–99)
POTASSIUM: 4.4 meq/L (ref 3.5–5.1)
Sodium: 137 mEq/L (ref 135–145)
TOTAL PROTEIN: 6.6 g/dL (ref 6.0–8.3)

## 2016-02-16 LAB — HEMOGLOBIN A1C: HEMOGLOBIN A1C: 5.5 % (ref 4.6–6.5)

## 2016-02-24 ENCOUNTER — Encounter: Payer: Self-pay | Admitting: Internal Medicine

## 2016-02-24 MED ORDER — LISINOPRIL 10 MG PO TABS: 10.0000 mg | ORAL_TABLET | Freq: Every day | ORAL | 1 refills | Status: DC

## 2016-02-24 NOTE — Telephone Encounter (Signed)
Refilled lisinopril.  Pt notified via my chart.

## 2016-03-03 ENCOUNTER — Encounter: Payer: Self-pay | Admitting: Internal Medicine

## 2016-03-03 ENCOUNTER — Ambulatory Visit (INDEPENDENT_AMBULATORY_CARE_PROVIDER_SITE_OTHER): Payer: 59 | Admitting: Internal Medicine

## 2016-03-03 DIAGNOSIS — I10 Essential (primary) hypertension: Secondary | ICD-10-CM

## 2016-03-03 DIAGNOSIS — J3489 Other specified disorders of nose and nasal sinuses: Secondary | ICD-10-CM

## 2016-03-03 NOTE — Progress Notes (Signed)
Pre visit review using our clinic review tool, if applicable. No additional management support is needed unless otherwise documented below in the visit note. 

## 2016-03-03 NOTE — Progress Notes (Signed)
Patient ID: Kristy Vega, female   DOB: 10/15/1960, 55 y.o.   MRN: [id number]   Subjective:    Patient ID: Kristy Vega, female    DOB: 06-Feb-1961, 54 y.o.   MRN: [id number]  HPI  Patient here for a scheduled follow up.  Was started on lisinopril last visit.  She is tolerating.  Blood pressure is doing better.  Stays active.  No chest pain.  No sob.  Discussed diet and exercise.  No nausea or vomiting.  Bowels stable.    Past Medical History:  Diagnosis Date  . History of colon polyps   . Vaginal delivery    X 2   Past Surgical History:  Procedure Laterality Date  . ABDOMINAL HYSTERECTOMY     Dr Josefa Half, for menorrhagia  . CHOLECYSTECTOMY    . INCISION / DRAINAGE HAND / FINGER     removal of sewing needle   Family History  Problem Relation Age of Onset  . Hypertension Mother   . Cancer Father     prostate  . Stroke Maternal Grandfather    Social History   Social History  . Marital status: Married    Spouse name: N/A  . Number of children: N/A  . Years of education: N/A   Social History Main Topics  . Smoking status: Never Smoker  . Smokeless tobacco: Never Used  . Alcohol use 0.0 oz/week     Comment: occasional  . Drug use: Unknown  . Sexual activity: Not Asked   Other Topics Concern  . None   Social History Narrative   Exercises regularly      Animals: Dogs and horses    Outpatient Encounter Prescriptions as of 03/03/2016  Medication Sig  . hydrochlorothiazide (MICROZIDE) 12.5 MG capsule Take 1 capsule (12.5 mg total) by mouth daily.  Marland Kitchen lisinopril (PRINIVIL,ZESTRIL) 10 MG tablet Take 1 tablet (10 mg total) by mouth daily.  . meloxicam (MOBIC) 15 MG tablet Take 1 tablet (15 mg total) by mouth daily as needed.  . metoprolol succinate (TOPROL-XL) 100 MG 24 hr tablet Take 1 tablet (100 mg total) by mouth daily. Take with or immediately following a meal.  . Multiple Vitamin (MULTIVITAMIN) tablet Take 1 tablet by mouth daily.  . mupirocin ointment  (BACTROBAN) 2 % Place 1 application into the nose 2 (two) times daily.   No facility-administered encounter medications on file as of 03/03/2016.     Review of Systems  Constitutional: Negative for appetite change and unexpected weight change.  HENT: Negative for congestion and sinus pressure.   Respiratory: Negative for cough, chest tightness and shortness of breath.   Cardiovascular: Negative for chest pain, palpitations and leg swelling.  Gastrointestinal: Negative for abdominal pain, diarrhea, nausea and vomiting.  Musculoskeletal: Negative for back pain and joint swelling.  Skin: Negative for color change and rash.  Neurological: Negative for dizziness, light-headedness and headaches.  Psychiatric/Behavioral: Negative for agitation and dysphoric mood.       Objective:     Blood pressure rechecked by me:  124/72  Physical Exam  Constitutional: She appears well-developed and well-nourished. No distress.  HENT:  Nose: Nose normal.  Mouth/Throat: Oropharynx is clear and moist.  Neck: Neck supple. No thyromegaly present.  Cardiovascular: Normal rate and regular rhythm.   Pulmonary/Chest: Breath sounds normal. No respiratory distress. She has no wheezes.  Abdominal: Soft. Bowel sounds are normal. There is no tenderness.  Musculoskeletal: She exhibits no edema or tenderness.  Lymphadenopathy:    She  has no cervical adenopathy.  Skin: No rash noted. No erythema.  Psychiatric: She has a normal mood and affect. Her behavior is normal.    BP 120/68   Pulse 88   Temp 98.1 F (36.7 C) (Oral)   Ht 5\' 2"  (1.575 m)   Wt 170 lb 6.4 oz (77.3 kg)   SpO2 97%   BMI 31.17 kg/m  Wt Readings from Last 3 Encounters:  03/03/16 170 lb 6.4 oz (77.3 kg)  01/30/16 173 lb 12.8 oz (78.8 kg)  12/17/15 172 lb 12.8 oz (78.4 kg)     Lab Results  Component Value Date   WBC 5.6 05/06/2015   HGB 14.7 05/06/2015   HCT 43.5 05/06/2015   PLT 269.0 05/06/2015   GLUCOSE 102 (H) 02/16/2016    CHOL 194 02/16/2016   TRIG 98.0 02/16/2016   HDL 67.90 02/16/2016   LDLDIRECT 122.8 07/24/2012   LDLCALC 107 (H) 02/16/2016   ALT 12 02/16/2016   AST 14 02/16/2016   NA 137 02/16/2016   K 4.4 02/16/2016   CL 102 02/16/2016   CREATININE 0.65 02/16/2016   BUN 12 02/16/2016   CO2 30 02/16/2016   TSH 2.05 05/06/2015   HGBA1C 5.5 02/16/2016   MICROALBUR <0.7 09/02/2014    Mm Screening Breast Tomo Bilateral  Result Date: 10/08/2015 CLINICAL DATA:  Screening. EXAM: 2D DIGITAL SCREENING BILATERAL MAMMOGRAM WITH CAD AND ADJUNCT TOMO COMPARISON:  Previous exam(s). ACR Breast Density Category b: There are scattered areas of fibroglandular density. FINDINGS: There are no findings suspicious for malignancy. Images were processed with CAD. IMPRESSION: No mammographic evidence of malignancy. A result letter of this screening mammogram will be mailed directly to the patient. RECOMMENDATION: Screening mammogram in one year. (Code:SM-B-01Y) BI-RADS CATEGORY  1: Negative. Electronically Signed   By: Pamelia Hoit M.D.   On: 10/08/2015 09:41       Assessment & Plan:   Problem List Items Addressed This Visit    Essential hypertension, benign (Chronic)    Blood pressure doing better on lisinopril.  Follow pressures.  Follow metabolic panel.  Recent creatinine - wnl.        Internal nasal lesion    Improved with bactroban.  Will notify me if incomplete resolution.            Einar Pheasant, MD

## 2016-03-08 ENCOUNTER — Encounter: Payer: Self-pay | Admitting: Internal Medicine

## 2016-03-08 NOTE — Assessment & Plan Note (Signed)
Blood pressure doing better on lisinopril.  Follow pressures.  Follow metabolic panel.  Recent creatinine - wnl.

## 2016-03-08 NOTE — Assessment & Plan Note (Signed)
Improved with bactroban.  Will notify me if incomplete resolution.

## 2016-04-24 ENCOUNTER — Encounter: Payer: Self-pay | Admitting: Internal Medicine

## 2016-04-26 ENCOUNTER — Other Ambulatory Visit: Payer: Self-pay | Admitting: Internal Medicine

## 2016-04-26 ENCOUNTER — Other Ambulatory Visit: Payer: Self-pay | Admitting: Surgical

## 2016-04-26 MED ORDER — LISINOPRIL 10 MG PO TABS: 10.0000 mg | ORAL_TABLET | Freq: Every day | ORAL | 1 refills | Status: DC

## 2016-06-04 ENCOUNTER — Encounter: Payer: 59 | Admitting: Internal Medicine

## 2016-06-07 ENCOUNTER — Ambulatory Visit (INDEPENDENT_AMBULATORY_CARE_PROVIDER_SITE_OTHER): Payer: 59 | Admitting: Vascular Surgery

## 2016-06-07 ENCOUNTER — Encounter (INDEPENDENT_AMBULATORY_CARE_PROVIDER_SITE_OTHER): Payer: Self-pay | Admitting: Vascular Surgery

## 2016-06-07 DIAGNOSIS — M79605 Pain in left leg: Secondary | ICD-10-CM | POA: Insufficient documentation

## 2016-06-07 DIAGNOSIS — I8392 Asymptomatic varicose veins of left lower extremity: Secondary | ICD-10-CM | POA: Insufficient documentation

## 2016-06-07 DIAGNOSIS — M150 Primary generalized (osteo)arthritis: Secondary | ICD-10-CM

## 2016-06-07 DIAGNOSIS — M159 Polyosteoarthritis, unspecified: Secondary | ICD-10-CM

## 2016-06-07 DIAGNOSIS — M1990 Unspecified osteoarthritis, unspecified site: Secondary | ICD-10-CM | POA: Insufficient documentation

## 2016-06-07 NOTE — Progress Notes (Signed)
MRN : [id number]  Kristy Vega is a 56 y.o. (08-Feb-1961) female who presents with chief complaint of  Chief Complaint  Patient presents with  . New Evaluation    Left leg varicose vein  .  History of Present Illness: Patient is seen for evaluation of left leg pain and mild left leg swelling. The patient first noticed the swelling remotely. The swelling is associated with pain and discoloration. The pain and swelling worsens with prolonged dependency and improves with elevation. The pain is unrelated to activity.  The patient notes that in the morning the legs are significantly improved but they steadily worsened throughout the course of the day.   The patient has some history of DJD but has not had her LS spine disease.   The patient denies claudication symptoms.  The patient denies symptoms consistent with rest pain.  The patient has no had any past angiography, interventions or vascular surgery.  Elevation makes the leg symptoms better, dependency makes them much worse. There is no history of ulcerations. The patient denies any recent changes in medications.  The patient has not been wearing graduated compression.  The patient denies a history of DVT or PE. There is no prior history of phlebitis. There is no history of primary lymphedema.  No history of malignancies. No history of trauma or groin or pelvic surgery. There is no history of radiation treatment to the groin or pelvis  The patient denies amaurosis fugax or recent TIA symptoms. There are no recent neurological changes noted. The patient denies recent episodes of angina or shortness of breath  Current Meds  Medication Sig  . chlorpheniramine-HYDROcodone (TUSSIONEX) 10-8 MG/5ML SUER   . hydrochlorothiazide (MICROZIDE) 12.5 MG capsule Take 1 capsule (12.5 mg total) by mouth daily.  Marland Kitchen lisinopril (PRINIVIL,ZESTRIL) 10 MG tablet Take 1 tablet (10 mg total) by mouth daily.  . meloxicam (MOBIC) 15 MG tablet Take 1 tablet  (15 mg total) by mouth daily as needed.  . metoprolol succinate (TOPROL-XL) 100 MG 24 hr tablet Take 1 tablet (100 mg total) by mouth daily. Take with or immediately following a meal.  . Multiple Vitamin (MULTIVITAMIN) tablet Take 1 tablet by mouth daily.    Past Medical History:  Diagnosis Date  . History of colon polyps   . Vaginal delivery    X 2    Past Surgical History:  Procedure Laterality Date  . ABDOMINAL HYSTERECTOMY     Dr Josefa Half, for menorrhagia  . CHOLECYSTECTOMY    . INCISION / DRAINAGE HAND / FINGER     removal of sewing needle    Social History Social History  Substance Use Topics  . Smoking status: Never Smoker  . Smokeless tobacco: Never Used  . Alcohol use 0.0 oz/week     Comment: occasional    Family History Family History  Problem Relation Age of Onset  . Hypertension Mother   . Cancer Father     prostate  . Stroke Maternal Grandfather   No family history of bleeding/clotting disorders, porphyria or autoimmune disease   Allergies  Allergen Reactions  . Penicillins      REVIEW OF SYSTEMS (Negative unless checked)  Constitutional: [] Weight loss  [] Fever  [] Chills Cardiac: [] Chest pain   [] Chest pressure   [] Palpitations   [] Shortness of breath when laying flat   [] Shortness of breath with exertion. Vascular:  [x] Pain in legs with walking   [x] Pain in legs with standing  [] History of DVT   [] Phlebitis   []   Swelling in legs   [] Varicose veins   [] Non-healing ulcers Pulmonary:   [] Uses home oxygen   [] Productive cough   [] Hemoptysis   [] Wheeze  [] COPD   [] Asthma Neurologic:  [] Dizziness   [] Seizures   [] History of stroke   [] History of TIA  [] Aphasia   [] Vissual changes   [] Weakness or numbness in arm   [] Weakness or numbness in leg Musculoskeletal:   [] Joint swelling   [x] Joint pain   [] Low back pain Hematologic:  [] Easy bruising  [] Easy bleeding   [] Hypercoagulable state   [] Anemic Gastrointestinal:  [] Diarrhea   [] Vomiting   [] Gastroesophageal reflux/heartburn   [] Difficulty swallowing. Genitourinary:  [] Chronic kidney disease   [] Difficult urination  [] Frequent urination   [] Blood in urine Skin:  [] Rashes   [] Ulcers  Psychological:  [] History of anxiety   []  History of major depression.  Physical Examination  Vitals:   06/07/16 1456  BP: 134/84  Pulse: 77  Resp: 16  Weight: 173 lb (78.5 kg)  Height: 5\' 2"  (1.575 m)   Body mass index is 31.64 kg/m. Gen: WD/WN, NAD Head: [place]/AT, No temporalis wasting.  Ear/Nose/Throat: Hearing grossly intact, nares w/o erythema or drainage, poor dentition Eyes: PER, EOMI, sclera nonicteric.  Neck: Supple, no masses.  No bruit or JVD.  Pulmonary:  Good air movement, clear to auscultation bilaterally, no use of accessory muscles.  Cardiac: RRR, normal S1, S2, no Murmurs. Vascular: scattered varicosities left leg with a moderate size vein coursing behind the left knee.  No masses Vessel Right Left  Radial Palpable Palpable  Ulnar Palpable Palpable  Brachial Palpable Palpable  Carotid Palpable Palpable  Femoral Palpable Palpable  Popliteal Palpable Palpable  PT Palpable Palpable  DP Palpable Palpable   Gastrointestinal: soft, non-distended. No guarding/no peritoneal signs.  Musculoskeletal: M/S 5/5 throughout.  No deformity or atrophy.  Neurologic: CN 2-12 intact. Pain and light touch intact in extremities.  Symmetrical.  Speech is fluent. Motor exam as listed above. Psychiatric: Judgment intact, Mood & affect appropriate for pt's clinical situation. Dermatologic: No rashes or ulcers noted.  No changes consistent with cellulitis. Lymph : No Cervical lymphadenopathy, no lichenification or skin changes of chronic lymphedema.  CBC Lab Results  Component Value Date   WBC 5.6 05/06/2015   HGB 14.7 05/06/2015   HCT 43.5 05/06/2015   MCV 95.4 05/06/2015   PLT 269.0 05/06/2015    BMET    Component Value Date/Time   NA 137 02/16/2016 0823   NA 137 02/04/2010   K  4.4 02/16/2016 0823   CL 102 02/16/2016 0823   CO2 30 02/16/2016 0823   GLUCOSE 102 (H) 02/16/2016 0823   BUN 12 02/16/2016 0823   BUN 22 (A) 02/04/2010   CREATININE 0.65 02/16/2016 0823   CALCIUM 9.2 02/16/2016 0823   CrCl cannot be calculated (Patient's most recent lab result is older than the maximum 21 days allowed.).  COAG No results found for: INR, PROTIME  Radiology No results found.   Assessment/Plan 1. Varicose veins of left lower extremity  Recommend:  The patient has some varicose veins that are painful and associated with swelling of the left.  I have had a long discussion with the patient regarding  varicose veins and why they cause symptoms.  Patient will begin wearing graduated compression stockings class 1 on a daily basis, beginning first thing in the morning and removing them in the evening. The patient is instructed specifically not to sleep in the stockings.    The patient  will also begin using over-the-counter analgesics such as Motrin 600 mg po TID to help control the symptoms.    In addition, behavioral modification including elevation during the day will be initiated.    Pending the results of these changes the  patient will be reevaluated in three months.   An  ultrasound of the venous system will be obtained.   Further plans will be based on the ultrasound results and whether conservative therapies are successful at eliminating the pain and swelling.  - VAS [place] LOWER EXTREMITY VENOUS REFLUX; Future  2. Pain of left lower extremity See #1 and #3  3. Primary osteoarthritis involving multiple joints Her symptoms are atypical for pure venous disease and I am suspicious that her pain may represent DJD of the joints either the knee or the LS spine and even the left hip is a possibility.  I recommend obtaining  plain X-rays of these areas but will defer this to Dr Nicki Reaper    Hortencia Pilar, MD  06/07/2016 10:04 PM

## 2016-06-10 ENCOUNTER — Ambulatory Visit (INDEPENDENT_AMBULATORY_CARE_PROVIDER_SITE_OTHER): Payer: 59

## 2016-06-10 ENCOUNTER — Other Ambulatory Visit (HOSPITAL_COMMUNITY)
Admission: RE | Admit: 2016-06-10 | Discharge: 2016-06-10 | Disposition: A | Discharge status: Home / Self Care | Payer: 59 | Source: Ambulatory Visit | Attending: Internal Medicine | Admitting: Internal Medicine

## 2016-06-10 ENCOUNTER — Encounter: Payer: Self-pay | Admitting: Internal Medicine

## 2016-06-10 ENCOUNTER — Ambulatory Visit (INDEPENDENT_AMBULATORY_CARE_PROVIDER_SITE_OTHER): Payer: 59 | Admitting: Internal Medicine

## 2016-06-10 VITALS — BP 124/78 | HR 68 | Temp 98.6°F | Resp 16 | Ht 62.0 in | Wt 173.4 lb

## 2016-06-10 DIAGNOSIS — I10 Essential (primary) hypertension: Secondary | ICD-10-CM | POA: Diagnosis not present

## 2016-06-10 DIAGNOSIS — Z1151 Encounter for screening for human papillomavirus (HPV): Secondary | ICD-10-CM | POA: Insufficient documentation

## 2016-06-10 DIAGNOSIS — M79605 Pain in left leg: Secondary | ICD-10-CM

## 2016-06-10 DIAGNOSIS — Z01419 Encounter for gynecological examination (general) (routine) without abnormal findings: Secondary | ICD-10-CM | POA: Insufficient documentation

## 2016-06-10 DIAGNOSIS — Z0000 Encounter for general adult medical examination without abnormal findings: Secondary | ICD-10-CM

## 2016-06-10 DIAGNOSIS — Z124 Encounter for screening for malignant neoplasm of cervix: Secondary | ICD-10-CM | POA: Diagnosis not present

## 2016-06-10 DIAGNOSIS — M1712 Unilateral primary osteoarthritis, left knee: Secondary | ICD-10-CM | POA: Diagnosis not present

## 2016-06-10 DIAGNOSIS — I8392 Asymptomatic varicose veins of left lower extremity: Secondary | ICD-10-CM

## 2016-06-10 DIAGNOSIS — M545 Low back pain: Secondary | ICD-10-CM | POA: Diagnosis not present

## 2016-06-10 LAB — TSH: TSH: 3.31 u[IU]/mL (ref 0.35–4.50)

## 2016-06-10 LAB — COMPREHENSIVE METABOLIC PANEL
ALBUMIN: 4 g/dL (ref 3.5–5.2)
ALK PHOS: 64 U/L (ref 39–117)
ALT: 13 U/L (ref 0–35)
AST: 14 U/L (ref 0–37)
BILIRUBIN TOTAL: 0.5 mg/dL (ref 0.2–1.2)
BUN: 17 mg/dL (ref 6–23)
CO2: 31 mEq/L (ref 19–32)
CREATININE: 0.71 mg/dL (ref 0.40–1.20)
Calcium: 9.3 mg/dL (ref 8.4–10.5)
Chloride: 103 mEq/L (ref 96–112)
GFR: 90.11 mL/min (ref >60.00)
GLUCOSE: 98 mg/dL (ref 70–99)
Potassium: 4 mEq/L (ref 3.5–5.1)
SODIUM: 138 meq/L (ref 135–145)
TOTAL PROTEIN: 7.2 g/dL (ref 6.0–8.3)

## 2016-06-10 LAB — CBC WITH DIFFERENTIAL/PLATELET
BASOS ABS: 0 10*3/uL (ref 0.0–0.1)
Basophils Relative: 0.9 % (ref 0.0–3.0)
EOS PCT: 4.4 % (ref 0.0–5.0)
Eosinophils Absolute: 0.2 10*3/uL (ref 0.0–0.7)
HCT: 40.3 % (ref 36.0–46.0)
HEMOGLOBIN: 13.9 g/dL (ref 12.0–15.0)
LYMPHS ABS: 2 10*3/uL (ref 0.7–4.0)
Lymphocytes Relative: 40.8 % (ref 12.0–46.0)
MCHC: 34.4 g/dL (ref 30.0–36.0)
MCV: 95.6 fl (ref 78.0–100.0)
MONO ABS: 0.4 10*3/uL (ref 0.1–1.0)
Monocytes Relative: 7.7 % (ref 3.0–12.0)
NEUTROS PCT: 46.2 % (ref 43.0–77.0)
Neutro Abs: 2.3 10*3/uL (ref 1.4–7.7)
Platelets: 277 10*3/uL (ref 150.0–400.0)
RBC: 4.22 Mil/uL (ref 3.87–5.11)
RDW: 11.9 % (ref 11.5–15.5)
WBC: 5 10*3/uL (ref 4.0–10.5)

## 2016-06-10 MED ORDER — LOSARTAN POTASSIUM 25 MG PO TABS: 25.0000 mg | ORAL_TABLET | Freq: Every day | ORAL | 2 refills | Status: DC

## 2016-06-10 NOTE — Patient Instructions (Signed)
Stop lisinopril.  Start losartan 25mg  per day.

## 2016-06-10 NOTE — Assessment & Plan Note (Addendum)
Physical today 06/10/16.  PAP 2014 - documented negative with negative HPV.   Repeat pap 06/10/16.  Colonoscopy 05/2100 - three polyps.  Recommended f/u in 10 years per pt.  Mammogram 10/08/15 birads I.

## 2016-06-10 NOTE — Progress Notes (Signed)
Pre-visit discussion using our clinic review tool. No additional management support is needed unless otherwise documented below in the visit note.  

## 2016-06-10 NOTE — Progress Notes (Signed)
Patient ID: Kristy Vega, female   DOB: [birth year], 58 y.o.   MRN: [id number]   Subjective:    Patient ID: Kristy Vega, female    DOB: 20-Dec-1960, 56 y.o.   MRN: [id number]  HPI  Patient here for her physical exam.  She reports she has been having pain behind her left knee to her left foot.  No numbness or tingling. Sitting aggravates. Better standing.  Going up steps aggravates.  She saw Dr Delana Meyer 06/07/16 for evaluation of leg pain.  Note reviewed.  Recommend compression hose with plans for possible ultrasound on f/u visit.   He felt needed further evaluation with xrays and possibly ortho referral (per pt).  She also reports a persistent dry cough.  Started a few months ago.  She is on lisinopril and recently started on this medication.  No fever.  No congestion.  Does not feel bad.  No chest pain.  No sob.  No acid reflux.  No abdominal pain or cramping.  Bowels stable.     Past Medical History:  Diagnosis Date  . History of colon polyps   . Vaginal delivery    X 2   Past Surgical History:  Procedure Laterality Date  . ABDOMINAL HYSTERECTOMY     Dr Josefa Half, for menorrhagia  . CHOLECYSTECTOMY    . INCISION / DRAINAGE HAND / FINGER     removal of sewing needle   Family History  Problem Relation Age of Onset  . Hypertension Mother   . Cancer Father     prostate  . Stroke Maternal Grandfather    Social History   Social History  . Marital status: Married    Spouse name: N/A  . Number of children: N/A  . Years of education: N/A   Social History Main Topics  . Smoking status: Never Smoker  . Smokeless tobacco: Never Used  . Alcohol use 0.0 oz/week     Comment: occasional  . Drug use: Unknown  . Sexual activity: Not Asked   Other Topics Concern  . None   Social History Narrative   Exercises regularly      Animals: Dogs and horses    Outpatient Encounter Prescriptions as of 06/10/2016  Medication Sig  . chlorpheniramine-HYDROcodone (TUSSIONEX) 10-8 MG/5ML SUER     . hydrochlorothiazide (MICROZIDE) 12.5 MG capsule Take 1 capsule (12.5 mg total) by mouth daily.  . meloxicam (MOBIC) 15 MG tablet Take 1 tablet (15 mg total) by mouth daily as needed.  . metoprolol succinate (TOPROL-XL) 100 MG 24 hr tablet Take 1 tablet (100 mg total) by mouth daily. Take with or immediately following a meal.  . Multiple Vitamin (MULTIVITAMIN) tablet Take 1 tablet by mouth daily.  . [DISCONTINUED] lisinopril (PRINIVIL,ZESTRIL) 10 MG tablet Take 1 tablet (10 mg total) by mouth daily.  Marland Kitchen losartan (COZAAR) 25 MG tablet Take 1 tablet (25 mg total) by mouth daily.   No facility-administered encounter medications on file as of 06/10/2016.     Review of Systems  Constitutional: Negative for appetite change and unexpected weight change.  HENT: Negative for congestion and sinus pressure.   Eyes: Negative for pain and visual disturbance.  Respiratory: Positive for cough. Negative for chest tightness and shortness of breath.   Cardiovascular: Negative for chest pain, palpitations and leg swelling.  Gastrointestinal: Negative for abdominal pain, diarrhea, nausea and vomiting.  Genitourinary: Negative for difficulty urinating and dysuria.  Musculoskeletal: Negative for back pain and joint swelling.  Left leg pain as outlined.    Skin: Negative for color change and rash.  Neurological: Negative for dizziness, light-headedness and headaches.  Hematological: Negative for adenopathy. Does not bruise/bleed easily.  Psychiatric/Behavioral: Negative for agitation and dysphoric mood.       Objective:    Physical Exam  Constitutional: She is oriented to person, place, and time. She appears well-developed and well-nourished. No distress.  HENT:  Nose: Nose normal.  Mouth/Throat: Oropharynx is clear and moist.  Eyes: Right eye exhibits no discharge. Left eye exhibits no discharge. No scleral icterus.  Neck: Neck supple. No thyromegaly present.  Cardiovascular: Normal rate and  regular rhythm.   Pulmonary/Chest: Breath sounds normal. No accessory muscle usage. No tachypnea. No respiratory distress. She has no decreased breath sounds. She has no wheezes. She has no rhonchi. Right breast exhibits no inverted nipple, no mass, no nipple discharge and no tenderness (no axillary adenopathy). Left breast exhibits no inverted nipple, no mass, no nipple discharge and no tenderness (no axilarry adenopathy).  Abdominal: Soft. Bowel sounds are normal. There is no tenderness.  Genitourinary:  Genitourinary Comments: Normal external genitalia.  Vaginal vault without lesions.  Pap smear performed.  Could not appreciate any adnexal masses or tenderness.    Musculoskeletal: She exhibits no edema or tenderness.  Lymphadenopathy:    She has no cervical adenopathy.  Neurological: She is alert and oriented to person, place, and time.  Skin: Skin is warm. No rash noted. No erythema.  Psychiatric: She has a normal mood and affect. Her behavior is normal.    BP 124/78 (BP Location: Left Arm, Patient Position: Sitting, Cuff Size: Large)   Pulse 68   Temp 98.6 F (37 C) (Oral)   Resp 16   Ht 5\' 2"  (1.575 m)   Wt 173 lb 6.4 oz (78.7 kg)   SpO2 97%   BMI 31.72 kg/m  Wt Readings from Last 3 Encounters:  06/10/16 173 lb 6.4 oz (78.7 kg)  06/07/16 173 lb (78.5 kg)  03/03/16 170 lb 6.4 oz (77.3 kg)     Lab Results  Component Value Date   WBC 5.0 06/10/2016   HGB 13.9 06/10/2016   HCT 40.3 06/10/2016   PLT 277.0 06/10/2016   GLUCOSE 98 06/10/2016   CHOL 194 02/16/2016   TRIG 98.0 02/16/2016   HDL 67.90 02/16/2016   LDLDIRECT 122.8 07/24/2012   LDLCALC 107 (H) 02/16/2016   ALT 13 06/10/2016   AST 14 06/10/2016   NA 138 06/10/2016   K 4.0 06/10/2016   CL 103 06/10/2016   CREATININE 0.71 06/10/2016   BUN 17 06/10/2016   CO2 31 06/10/2016   TSH 3.31 06/10/2016   HGBA1C 5.5 02/16/2016   MICROALBUR <0.7 09/02/2014    Mm Screening Breast Tomo Bilateral  Result Date:  10/08/2015 CLINICAL DATA:  Screening. EXAM: 2D DIGITAL SCREENING BILATERAL MAMMOGRAM WITH CAD AND ADJUNCT TOMO COMPARISON:  Previous exam(s). ACR Breast Density Category b: There are scattered areas of fibroglandular density. FINDINGS: There are no findings suspicious for malignancy. Images were processed with CAD. IMPRESSION: No mammographic evidence of malignancy. A result letter of this screening mammogram will be mailed directly to the patient. RECOMMENDATION: Screening mammogram in one year. (Code:SM-B-01Y) BI-RADS CATEGORY  1: Negative. Electronically Signed   By: Pamelia Hoit M.D.   On: 10/08/2015 09:41       Assessment & Plan:   Problem List Items Addressed This Visit    Essential hypertension, benign (Chronic)    Blood pressure  better.  With cough.  May be related to lisinopril.  Stop lisinopril.  Start losartan as directed.  Follow.  Get her back in soon to reassess blood pressure.        Relevant Medications   losartan (COZAAR) 25 MG tablet   Other Relevant Orders   CBC with Differential/Platelet (Completed)   Comprehensive metabolic panel (Completed)   TSH (Completed)   Health care maintenance    Physical today 06/10/16.  PAP 2014 - documented negative with negative HPV.   Repeat pap 06/10/16.  Colonoscopy 05/2100 - three polyps.  Recommended f/u in 10 years per pt.  Mammogram 10/08/15 birads I.       Left leg pain - Primary    Evaluated by [facility] as outlined.  Check L-S spine xray and left knee xray.  May need referral to ortho.        Relevant Orders   DG Lumbar Spine 2-3 Views (Completed)   DG Knee 1-2 Views Left (Completed)   Varicose veins of left lower extremity    Compression hose.  Has seen Dr Delana Meyer.        Relevant Medications   losartan (COZAAR) 25 MG tablet    Other Visit Diagnoses    Cervical cancer screening       Relevant Orders   Cytology - PAP (Completed)       Einar Pheasant, MD

## 2016-06-11 ENCOUNTER — Encounter: Payer: Self-pay | Admitting: Internal Medicine

## 2016-06-11 LAB — CYTOLOGY - PAP
DIAGNOSIS: NEGATIVE
HPV (WINDOPATH): NOT DETECTED

## 2016-06-12 ENCOUNTER — Encounter: Payer: Self-pay | Admitting: Internal Medicine

## 2016-06-14 ENCOUNTER — Other Ambulatory Visit: Payer: Self-pay | Admitting: Internal Medicine

## 2016-06-14 ENCOUNTER — Telehealth: Payer: Self-pay

## 2016-06-14 DIAGNOSIS — M25562 Pain in left knee: Secondary | ICD-10-CM

## 2016-06-14 DIAGNOSIS — M79605 Pain in left leg: Secondary | ICD-10-CM

## 2016-06-14 NOTE — Telephone Encounter (Signed)
Pt informed she will give [place] a call in a few days if not heard from ortho.

## 2016-06-14 NOTE — Telephone Encounter (Signed)
-----   Message from Einar Pheasant, MD sent at 06/14/2016  3:33 AM EST ----- I have placed the order for the referral.  Dr Delana Meyer ordered the ultrasound.  Can see what ortho says, but would recommend keeping appt for now.

## 2016-06-14 NOTE — Telephone Encounter (Signed)
Pt requested a call [phone]

## 2016-06-14 NOTE — Progress Notes (Signed)
Orders placed for ortho referral  

## 2016-06-14 NOTE — Telephone Encounter (Signed)
lmtrc

## 2016-06-20 ENCOUNTER — Encounter: Payer: Self-pay | Admitting: Internal Medicine

## 2016-06-20 NOTE — Assessment & Plan Note (Signed)
Compression hose.  Has seen Dr Delana Meyer.

## 2016-06-20 NOTE — Assessment & Plan Note (Signed)
Blood pressure better.  With cough.  May be related to lisinopril.  Stop lisinopril.  Start losartan as directed.  Follow.  Get her back in soon to reassess blood pressure.

## 2016-06-20 NOTE — Assessment & Plan Note (Signed)
Evaluated by [facility] as outlined.  Check L-S spine xray and left knee xray.  May need referral to ortho.

## 2016-06-25 DIAGNOSIS — M1712 Unilateral primary osteoarthritis, left knee: Secondary | ICD-10-CM | POA: Diagnosis not present

## 2016-06-25 DIAGNOSIS — M25562 Pain in left knee: Secondary | ICD-10-CM | POA: Diagnosis not present

## 2016-06-25 DIAGNOSIS — G8929 Other chronic pain: Secondary | ICD-10-CM | POA: Diagnosis not present

## 2016-07-29 ENCOUNTER — Ambulatory Visit (INDEPENDENT_AMBULATORY_CARE_PROVIDER_SITE_OTHER): Payer: 59 | Admitting: Vascular Surgery

## 2016-07-29 ENCOUNTER — Encounter (INDEPENDENT_AMBULATORY_CARE_PROVIDER_SITE_OTHER): Payer: 59

## 2016-08-02 ENCOUNTER — Other Ambulatory Visit: Payer: Self-pay | Admitting: Internal Medicine

## 2016-08-13 ENCOUNTER — Encounter: Payer: Self-pay | Admitting: Internal Medicine

## 2016-08-13 ENCOUNTER — Ambulatory Visit (INDEPENDENT_AMBULATORY_CARE_PROVIDER_SITE_OTHER): Payer: 59 | Admitting: Internal Medicine

## 2016-08-13 VITALS — BP 116/68 | HR 73 | Temp 97.6°F | Ht 62.0 in | Wt 172.4 lb

## 2016-08-13 DIAGNOSIS — M25562 Pain in left knee: Secondary | ICD-10-CM

## 2016-08-13 DIAGNOSIS — I10 Essential (primary) hypertension: Secondary | ICD-10-CM

## 2016-08-13 LAB — BASIC METABOLIC PANEL
BUN: 15 mg/dL (ref 6–23)
CALCIUM: 9.5 mg/dL (ref 8.4–10.5)
CO2: 28 meq/L (ref 19–32)
Chloride: 103 mEq/L (ref 96–112)
Creatinine, Ser: 0.72 mg/dL (ref 0.40–1.20)
GFR: 88.61 mL/min (ref >60.00)
GLUCOSE: 98 mg/dL (ref 70–99)
POTASSIUM: 3.9 meq/L (ref 3.5–5.1)
SODIUM: 139 meq/L (ref 135–145)

## 2016-08-13 NOTE — Progress Notes (Signed)
Pre visit review using our clinic review tool, if applicable. No additional management support is needed unless otherwise documented below in the visit note. 

## 2016-08-13 NOTE — Progress Notes (Signed)
Patient ID: Kristy Vega, female   DOB: Jul 04, 1960, 56 y.o.   MRN: [id number]   Subjective:    Patient ID: Kristy Vega, female    DOB: [birth year], 56 y.o.   MRN: [id number]  HPI  Patient here for a scheduled follow up.  Last visit, she was having issues with increased cough.  Lisinopril was stopped.  Cough resolved.  On losartan now.  Tolerating.  She was having problems with her left knee.  Saw ortho.  S/p injection.  Also taking mobic.  No chest pain.  No sob.  No acid reflux.  No abdominal pain.  Bowels moving.     Past Medical History:  Diagnosis Date  . History of colon polyps   . Vaginal delivery    X 2   Past Surgical History:  Procedure Laterality Date  . ABDOMINAL HYSTERECTOMY     Dr Josefa Half, for menorrhagia  . CHOLECYSTECTOMY    . INCISION / DRAINAGE HAND / FINGER     removal of sewing needle   Family History  Problem Relation Age of Onset  . Hypertension Mother   . Cancer Father        prostate  . Stroke Maternal Grandfather    Social History   Social History  . Marital status: Married    Spouse name: N/A  . Number of children: N/A  . Years of education: N/A   Social History Main Topics  . Smoking status: Never Smoker  . Smokeless tobacco: Never Used  . Alcohol use 0.0 oz/week     Comment: occasional  . Drug use: Unknown  . Sexual activity: Not Asked   Other Topics Concern  . None   Social History Narrative   Exercises regularly      Animals: Dogs and horses    Outpatient Encounter Prescriptions as of 08/13/2016  Medication Sig  . chlorpheniramine-HYDROcodone (TUSSIONEX) 10-8 MG/5ML SUER   . hydrochlorothiazide (MICROZIDE) 12.5 MG capsule Take 1 capsule (12.5 mg total) by mouth daily.  Marland Kitchen losartan (COZAAR) 25 MG tablet TAKE 1 TABLET (25 MG TOTAL) BY MOUTH DAILY.  . meloxicam (MOBIC) 15 MG tablet Take 1 tablet (15 mg total) by mouth daily as needed.  . metoprolol succinate (TOPROL-XL) 100 MG 24 hr tablet Take 1 tablet (100 mg total) by mouth  daily. Take with or immediately following a meal.  . Multiple Vitamin (MULTIVITAMIN) tablet Take 1 tablet by mouth daily.   No facility-administered encounter medications on file as of 08/13/2016.     Review of Systems  Constitutional: Negative for appetite change and unexpected weight change.  HENT: Negative for congestion and sinus pressure.   Respiratory: Negative for cough, chest tightness and shortness of breath.   Cardiovascular: Negative for chest pain, palpitations and leg swelling.  Gastrointestinal: Negative for abdominal pain, diarrhea, nausea and vomiting.  Genitourinary: Negative for difficulty urinating and dysuria.  Musculoskeletal: Negative for back pain.       Left knee pain.    Skin: Negative for color change and rash.  Neurological: Negative for dizziness, light-headedness and headaches.  Psychiatric/Behavioral: Negative for agitation and dysphoric mood.       Objective:    Physical Exam  Constitutional: She appears well-developed and well-nourished. No distress.  HENT:  Nose: Nose normal.  Mouth/Throat: Oropharynx is clear and moist.  Neck: Neck supple. No thyromegaly present.  Cardiovascular: Normal rate and regular rhythm.   Pulmonary/Chest: Breath sounds normal. No respiratory distress. She has no wheezes.  Abdominal: Soft.  Bowel sounds are normal. There is no tenderness.  Musculoskeletal: She exhibits no edema or tenderness.  Lymphadenopathy:    She has no cervical adenopathy.  Skin: No rash noted. No erythema.  Psychiatric: She has a normal mood and affect. Her behavior is normal.    BP 116/68   Pulse 73   Temp 97.6 F (36.4 C) (Oral)   Ht 5\' 2"  (1.575 m)   Wt 172 lb 6.4 oz (78.2 kg)   SpO2 95%   BMI 31.53 kg/m  Wt Readings from Last 3 Encounters:  08/13/16 172 lb 6.4 oz (78.2 kg)  06/10/16 173 lb 6.4 oz (78.7 kg)  06/07/16 173 lb (78.5 kg)     Lab Results  Component Value Date   WBC 5.0 06/10/2016   HGB 13.9 06/10/2016   HCT 40.3  06/10/2016   PLT 277.0 06/10/2016   GLUCOSE 98 08/13/2016   CHOL 194 02/16/2016   TRIG 98.0 02/16/2016   HDL 67.90 02/16/2016   LDLDIRECT 122.8 07/24/2012   LDLCALC 107 (H) 02/16/2016   ALT 13 06/10/2016   AST 14 06/10/2016   NA 139 08/13/2016   K 3.9 08/13/2016   CL 103 08/13/2016   CREATININE 0.72 08/13/2016   BUN 15 08/13/2016   CO2 28 08/13/2016   TSH 3.31 06/10/2016   HGBA1C 5.5 02/16/2016   MICROALBUR <0.7 09/02/2014    Dg Lumbar Spine 2-3 Views  Result Date: 06/10/2016 CLINICAL DATA:  Left leg pain EXAM: LUMBAR SPINE - 2-3 VIEW COMPARISON:  Lumbar spine films of 03/13/2015 FINDINGS: The lumbar vertebrae are in normal alignment. Intervertebral disc spaces appear normal. No compression deformity is seen. The SI joints are corticated. IMPRESSION: Normal alignment.  Normal intervertebral disc spaces. Electronically Signed   By: Ivar Drape M.D.   On: 06/10/2016 09:45   Dg Knee 1-2 Views Left  Result Date: 06/10/2016 CLINICAL DATA:  Left knee pain EXAM: LEFT KNEE - 1-2 VIEW COMPARISON:  None. FINDINGS: There is mild tricompartmental degenerative joint disease of the left knee primarily involving the patellofemoral articulation with spurring and some loss of medial compartment joint space with mild spurring. No acute abnormality is seen. No joint effusion is noted. 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 Electronically Signed   By: Ivar Drape M.D.   On: 06/10/2016 09:46       Assessment & Plan:   Problem List Items Addressed This Visit    Essential hypertension, benign - Primary (Chronic)    Blood pressure under good control.  Continue same medication regimen.  Follow pressures.  Follow metabolic panel.        Relevant Orders   Basic metabolic panel (Completed)    Other Visit Diagnoses    Left knee pain, unspecified chronicity       Saw ortho.  s/p injection.         Einar Pheasant, MD

## 2016-08-14 ENCOUNTER — Encounter: Payer: Self-pay | Admitting: Internal Medicine

## 2016-08-22 ENCOUNTER — Encounter: Payer: Self-pay | Admitting: Internal Medicine

## 2016-08-22 NOTE — Assessment & Plan Note (Signed)
Blood pressure under good control.  Continue same medication regimen.  Follow pressures.  Follow metabolic panel.   

## 2016-10-19 DIAGNOSIS — H5213 Myopia, bilateral: Secondary | ICD-10-CM | POA: Diagnosis not present

## 2016-10-27 ENCOUNTER — Encounter: Payer: Self-pay | Admitting: Internal Medicine

## 2016-10-27 ENCOUNTER — Other Ambulatory Visit: Payer: Self-pay

## 2016-10-27 MED ORDER — HYDROCHLOROTHIAZIDE 12.5 MG PO CAPS: 12.5000 mg | ORAL_CAPSULE | Freq: Every day | ORAL | 3 refills | Status: DC

## 2016-10-27 MED ORDER — METOPROLOL SUCCINATE ER 100 MG PO TB24: 100.0000 mg | ORAL_TABLET | Freq: Every day | ORAL | 1 refills | Status: DC

## 2016-10-27 MED ORDER — LOSARTAN POTASSIUM 25 MG PO TABS: 25.0000 mg | ORAL_TABLET | Freq: Every day | ORAL | 1 refills | Status: DC

## 2016-11-15 ENCOUNTER — Other Ambulatory Visit: Payer: Self-pay | Admitting: Internal Medicine

## 2016-11-15 DIAGNOSIS — Z1231 Encounter for screening mammogram for malignant neoplasm of breast: Secondary | ICD-10-CM

## 2016-11-22 ENCOUNTER — Ambulatory Visit (INDEPENDENT_AMBULATORY_CARE_PROVIDER_SITE_OTHER): Payer: 59 | Admitting: Internal Medicine

## 2016-11-22 ENCOUNTER — Encounter: Payer: Self-pay | Admitting: Internal Medicine

## 2016-11-22 VITALS — BP 136/82 | HR 59 | Temp 98.6°F | Resp 12 | Ht 62.0 in | Wt 175.0 lb

## 2016-11-22 DIAGNOSIS — I10 Essential (primary) hypertension: Secondary | ICD-10-CM

## 2016-11-22 DIAGNOSIS — Z860100 Personal history of colon polyps, unspecified: Secondary | ICD-10-CM

## 2016-11-22 DIAGNOSIS — R238 Other skin changes: Secondary | ICD-10-CM | POA: Insufficient documentation

## 2016-11-22 DIAGNOSIS — R233 Spontaneous ecchymoses: Secondary | ICD-10-CM

## 2016-11-22 LAB — BASIC METABOLIC PANEL
BUN: 12 mg/dL (ref 6–23)
CALCIUM: 9.5 mg/dL (ref 8.4–10.5)
CHLORIDE: 99 meq/L (ref 96–112)
CO2: 31 meq/L (ref 19–32)
CREATININE: 0.65 mg/dL (ref 0.40–1.20)
GFR: 99.61 mL/min (ref >60.00)
Glucose, Bld: 107 mg/dL — ABNORMAL HIGH (ref 70–99)
Potassium: 4.2 mEq/L (ref 3.5–5.1)
Sodium: 136 mEq/L (ref 135–145)

## 2016-11-22 LAB — CBC WITH DIFFERENTIAL/PLATELET
BASOS PCT: 0.7 % (ref 0.0–3.0)
Basophils Absolute: 0 10*3/uL (ref 0.0–0.1)
EOS ABS: 0.1 10*3/uL (ref 0.0–0.7)
Eosinophils Relative: 2.8 % (ref 0.0–5.0)
HEMATOCRIT: 43.3 % (ref 36.0–46.0)
Hemoglobin: 14.6 g/dL (ref 12.0–15.0)
LYMPHS PCT: 37.4 % (ref 12.0–46.0)
Lymphs Abs: 1.9 10*3/uL (ref 0.7–4.0)
MCHC: 33.8 g/dL (ref 30.0–36.0)
MCV: 97.7 fl (ref 78.0–100.0)
Monocytes Absolute: 0.3 10*3/uL (ref 0.1–1.0)
Monocytes Relative: 6.7 % (ref 3.0–12.0)
NEUTROS ABS: 2.7 10*3/uL (ref 1.4–7.7)
Neutrophils Relative %: 52.4 % (ref 43.0–77.0)
PLATELETS: 305 10*3/uL (ref 150.0–400.0)
RBC: 4.43 Mil/uL (ref 3.87–5.11)
RDW: 12.5 % (ref 11.5–15.5)
WBC: 5.1 10*3/uL (ref 4.0–10.5)

## 2016-11-22 LAB — PROTIME-INR
INR: 1 ratio (ref 0.8–1.0)
PROTHROMBIN TIME: 10.7 s (ref 9.6–13.1)

## 2016-11-22 MED ORDER — LOSARTAN POTASSIUM-HCTZ 50-12.5 MG PO TABS: 1.0000 | ORAL_TABLET | Freq: Every day | ORAL | 3 refills | Status: DC

## 2016-11-22 MED ORDER — ASPIRIN 81 MG PO TABS
81.0000 mg | ORAL_TABLET | Freq: Every day | ORAL | 0 refills | Status: DC
Start: 2016-11-22 — End: 2018-08-24

## 2016-11-22 NOTE — Progress Notes (Signed)
Pre-visit discussion using our clinic review tool. No additional management support is needed unless otherwise documented below in the visit note.  

## 2016-11-22 NOTE — Progress Notes (Signed)
Patient ID: Kristy Vega, female   DOB: 1960/08/17, 56 y.o.   MRN: [id number]   Subjective:    Patient ID: Kristy Vega, female    DOB: 07/07/1960, 56 y.o.   MRN: [id number]  HPI  Patient here for a scheduled follow up.  She reports she is doing well.  Feels good.  No chest pain.  No sob.  Tries to stay active.  Taking losartan.  Tolerating.  No acid reflux.  No abdominal pain.  Bowels moving.  Some left knee pain.  S/p injection.  Helped.     Past Medical History:  Diagnosis Date  . History of colon polyps   . Vaginal delivery    X 2   Past Surgical History:  Procedure Laterality Date  . ABDOMINAL HYSTERECTOMY     Dr Josefa Half, for menorrhagia  . CHOLECYSTECTOMY    . INCISION / DRAINAGE HAND / FINGER     removal of sewing needle   Family History  Problem Relation Age of Onset  . Hypertension Mother   . Cancer Father        prostate  . Stroke Maternal Grandfather    Social History   Social History  . Marital status: Married    Spouse name: N/A  . Number of children: N/A  . Years of education: N/A   Social History Main Topics  . Smoking status: Never Smoker  . Smokeless tobacco: Never Used  . Alcohol use 0.0 oz/week     Comment: occasional  . Drug use: Unknown  . Sexual activity: Not Asked   Other Topics Concern  . None   Social History Narrative   Exercises regularly      Animals: Dogs and horses    Outpatient Encounter Prescriptions as of 11/22/2016  Medication Sig  . metoprolol succinate (TOPROL-XL) 100 MG 24 hr tablet Take 1 tablet (100 mg total) by mouth daily. Take with or immediately following a meal.  . Multiple Vitamin (MULTIVITAMIN) tablet Take 1 tablet by mouth daily.  . [DISCONTINUED] hydrochlorothiazide (MICROZIDE) 12.5 MG capsule Take 1 capsule (12.5 mg total) by mouth daily.  . [DISCONTINUED] losartan (COZAAR) 25 MG tablet Take 1 tablet (25 mg total) by mouth daily.  . [DISCONTINUED] meloxicam (MOBIC) 15 MG tablet Take 1 tablet (15 mg total)  by mouth daily as needed.  Marland Kitchen aspirin 81 MG tablet Take 1 tablet (81 mg total) by mouth daily.  Marland Kitchen losartan-hydrochlorothiazide (HYZAAR) 50-12.5 MG tablet Take 1 tablet by mouth daily.   No facility-administered encounter medications on file as of 11/22/2016.     Review of Systems  Constitutional: Negative for appetite change and unexpected weight change.  HENT: Negative for congestion and sinus pressure.   Respiratory: Negative for cough, chest tightness and shortness of breath.   Cardiovascular: Negative for chest pain, palpitations and leg swelling.  Gastrointestinal: Negative for abdominal pain, diarrhea, nausea and vomiting.  Genitourinary: Negative for difficulty urinating and dysuria.  Musculoskeletal: Negative for back pain.       Left knee pain.    Skin: Negative for color change and rash.  Neurological: Negative for dizziness, light-headedness and headaches.  Psychiatric/Behavioral: Negative for agitation and dysphoric mood.       Objective:    Physical Exam  Constitutional: She appears well-developed and well-nourished. No distress.  HENT:  Nose: Nose normal.  Mouth/Throat: Oropharynx is clear and moist.  Neck: Neck supple. No thyromegaly present.  Cardiovascular: Normal rate and regular rhythm.   Pulmonary/Chest: Breath sounds  normal. No respiratory distress. She has no wheezes.  Abdominal: Soft. Bowel sounds are normal. There is no tenderness.  Musculoskeletal: She exhibits no edema or tenderness.  Lymphadenopathy:    She has no cervical adenopathy.  Skin: No rash noted. No erythema.  Psychiatric: She has a normal mood and affect. Her behavior is normal.    BP 136/82 (BP Location: Left Arm, Patient Position: Sitting, Cuff Size: Normal)   Pulse (!) 59   Temp 98.6 F (37 C) (Oral)   Resp 12   Ht 5\' 2"  (1.575 m)   Wt 175 lb (79.4 kg)   SpO2 97%   BMI 32.01 kg/m  Wt Readings from Last 3 Encounters:  11/22/16 175 lb (79.4 kg)  08/13/16 172 lb 6.4 oz (78.2 kg)    06/10/16 173 lb 6.4 oz (78.7 kg)     Lab Results  Component Value Date   WBC 5.1 11/22/2016   HGB 14.6 11/22/2016   HCT 43.3 11/22/2016   PLT 305.0 11/22/2016   GLUCOSE 107 (H) 11/22/2016   CHOL 194 02/16/2016   TRIG 98.0 02/16/2016   HDL 67.90 02/16/2016   LDLDIRECT 122.8 07/24/2012   LDLCALC 107 (H) 02/16/2016   ALT 13 06/10/2016   AST 14 06/10/2016   NA 136 11/22/2016   K 4.2 11/22/2016   CL 99 11/22/2016   CREATININE 0.65 11/22/2016   BUN 12 11/22/2016   CO2 31 11/22/2016   TSH 3.31 06/10/2016   INR 1.0 11/22/2016   HGBA1C 5.5 02/16/2016   MICROALBUR <0.7 09/02/2014    Dg Lumbar Spine 2-3 Views  Result Date: 06/10/2016 CLINICAL DATA:  Left leg pain EXAM: LUMBAR SPINE - 2-3 VIEW COMPARISON:  Lumbar spine films of 03/13/2015 FINDINGS: The lumbar vertebrae are in normal alignment. Intervertebral disc spaces appear normal. No compression deformity is seen. The SI joints are corticated. IMPRESSION: Normal alignment.  Normal intervertebral disc spaces. Electronically Signed   By: Ivar Drape M.D.   On: 06/10/2016 09:45   Dg Knee 1-2 Views Left  Result Date: 06/10/2016 CLINICAL DATA:  Left knee pain EXAM: LEFT KNEE - 1-2 VIEW COMPARISON:  None. FINDINGS: There is mild tricompartmental degenerative joint disease of the left knee primarily involving the patellofemoral articulation with spurring and some loss of medial compartment joint space with mild spurring. No acute abnormality is seen. No joint effusion is noted. 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 Electronically Signed   By: Ivar Drape M.D.   On: 06/10/2016 09:46       Assessment & Plan:   Problem List Items Addressed This Visit    Easy bruising    Recommended aspirin given history of hypertension.  Some easy bruising noted.  Check cbc and pt/inr.       Relevant Orders   CBC with Differential/Platelet (Completed)   Protime-INR (Completed)   Essential hypertension, benign -  Primary (Chronic)    Blood pressure on recheck slightly elevated.  Will increased losartan to 50mg  and give rx for combined losartan/hctz 50/12.5 q day.  Follow pressures.  Follow metabolic panel.       Relevant Medications   losartan-hydrochlorothiazide (HYZAAR) 50-12.5 MG tablet   aspirin 81 MG tablet   Other Relevant Orders   Basic metabolic panel (Completed)   History of colonic polyps    Colonoscopy 2012 - hyperplastic polyps.  Recommended f/u in 10 years.           Einar Pheasant, MD

## 2016-11-23 ENCOUNTER — Encounter: Payer: Self-pay | Admitting: Internal Medicine

## 2016-11-23 NOTE — Assessment & Plan Note (Signed)
Colonoscopy 2012 - hyperplastic polyps.  Recommended f/u in 10 years.

## 2016-11-23 NOTE — Assessment & Plan Note (Signed)
Blood pressure on recheck slightly elevated.  Will increased losartan to 50mg  and give rx for combined losartan/hctz 50/12.5 q day.  Follow pressures.  Follow metabolic panel.

## 2016-11-23 NOTE — Assessment & Plan Note (Signed)
Recommended aspirin given history of hypertension.  Some easy bruising noted.  Check cbc and pt/inr.

## 2016-11-25 ENCOUNTER — Encounter: Payer: Self-pay | Admitting: Internal Medicine

## 2016-11-29 ENCOUNTER — Ambulatory Visit
Admission: RE | Admit: 2016-11-29 | Discharge: 2016-11-29 | Disposition: A | Discharge status: Home / Self Care | Payer: 59 | Source: Ambulatory Visit | Attending: Internal Medicine | Admitting: Internal Medicine

## 2016-11-29 DIAGNOSIS — Z1231 Encounter for screening mammogram for malignant neoplasm of breast: Secondary | ICD-10-CM | POA: Diagnosis not present

## 2016-12-02 NOTE — Telephone Encounter (Signed)
Mailed copy

## 2017-02-21 ENCOUNTER — Encounter: Payer: Self-pay | Admitting: Internal Medicine

## 2017-02-24 ENCOUNTER — Ambulatory Visit (INDEPENDENT_AMBULATORY_CARE_PROVIDER_SITE_OTHER): Payer: 59 | Admitting: Internal Medicine

## 2017-02-24 ENCOUNTER — Encounter: Payer: Self-pay | Admitting: Internal Medicine

## 2017-02-24 VITALS — BP 128/80 | HR 69 | Temp 98.1°F | Resp 16 | Ht 61.81 in | Wt 169.4 lb

## 2017-02-24 DIAGNOSIS — F439 Reaction to severe stress, unspecified: Secondary | ICD-10-CM | POA: Diagnosis not present

## 2017-02-24 DIAGNOSIS — I10 Essential (primary) hypertension: Secondary | ICD-10-CM | POA: Diagnosis not present

## 2017-02-24 DIAGNOSIS — Z13220 Encounter for screening for lipoid disorders: Secondary | ICD-10-CM | POA: Diagnosis not present

## 2017-02-24 MED ORDER — LOSARTAN POTASSIUM-HCTZ 50-12.5 MG PO TABS: 1.0000 | ORAL_TABLET | Freq: Every day | ORAL | 1 refills | Status: DC

## 2017-02-24 MED ORDER — METOPROLOL SUCCINATE ER 100 MG PO TB24: 100.0000 mg | ORAL_TABLET | Freq: Every day | ORAL | 1 refills | Status: DC

## 2017-02-24 NOTE — Progress Notes (Signed)
Patient ID: Kristy Vega, female   DOB: 1960/06/24, 56 y.o.   MRN: [id number]   Subjective:    Patient ID: Kristy Vega, female    DOB: 1961-01-19, 56 y.o.   MRN: [id number]  HPI  Patient here for a scheduled follow up.  She reports she is doing better.  Has lost weight.  Discussed diet and exercise.  No chest pain.  No sob.  No acid reflux.  No abdominal pain.  Bowels moving.  Handling stress.  Discussed with her today.  Reviewed outside blood pressure checks.  Most averaging 115-130/60-70s.    Past Medical History:  Diagnosis Date  . History of colon polyps   . Vaginal delivery    X 2   Past Surgical History:  Procedure Laterality Date  . ABDOMINAL HYSTERECTOMY     Dr Josefa Half, for menorrhagia  . CHOLECYSTECTOMY    . INCISION / DRAINAGE HAND / FINGER     removal of sewing needle   Family History  Problem Relation Age of Onset  . Hypertension Mother   . Cancer Father        prostate  . Stroke Maternal Grandfather   . Breast cancer Neg Hx    Social History   Socioeconomic History  . Marital status: Married    Spouse name: None  . Number of children: None  . Years of education: None  . Highest education level: None  Social Needs  . Financial resource strain: None  . Food insecurity - worry: None  . Food insecurity - inability: None  . Transportation needs - medical: None  . Transportation needs - non-medical: None  Occupational History  . None  Tobacco Use  . Smoking status: Never Smoker  . Smokeless tobacco: Never Used  Substance and Sexual Activity  . Alcohol use: Yes    Alcohol/week: 0.0 oz    Comment: occasional  . Drug use: None  . Sexual activity: None  Other Topics Concern  . None  Social History Narrative   Exercises regularly      Animals: Dogs and horses    Outpatient Encounter Medications as of 02/24/2017  Medication Sig  . aspirin 81 MG tablet Take 1 tablet (81 mg total) by mouth daily.  Marland Kitchen losartan-hydrochlorothiazide (HYZAAR) 50-12.5 MG  tablet Take 1 tablet daily by mouth.  . metoprolol succinate (TOPROL-XL) 100 MG 24 hr tablet Take 1 tablet (100 mg total) daily by mouth. Take with or immediately following a meal.  . Multiple Vitamin (MULTIVITAMIN) tablet Take 1 tablet by mouth daily.  . [DISCONTINUED] losartan-hydrochlorothiazide (HYZAAR) 50-12.5 MG tablet Take 1 tablet by mouth daily.  . [DISCONTINUED] metoprolol succinate (TOPROL-XL) 100 MG 24 hr tablet Take 1 tablet (100 mg total) by mouth daily. Take with or immediately following a meal.   No facility-administered encounter medications on file as of 02/24/2017.     Review of Systems  Constitutional: Negative for appetite change and unexpected weight change.  HENT: Negative for congestion and sinus pressure.   Respiratory: Negative for cough, chest tightness and shortness of breath.   Cardiovascular: Negative for chest pain, palpitations and leg swelling.  Gastrointestinal: Negative for abdominal pain, diarrhea and nausea.  Genitourinary: Negative for difficulty urinating and dysuria.  Musculoskeletal: Negative for joint swelling and myalgias.  Skin: Negative for color change and rash.  Neurological: Negative for dizziness, light-headedness and headaches.  Psychiatric/Behavioral: Negative for agitation and dysphoric mood.       Objective:    Physical Exam  Constitutional: She appears well-developed and well-nourished. No distress.  HENT:  Nose: Nose normal.  Mouth/Throat: Oropharynx is clear and moist.  Neck: Neck supple. No thyromegaly present.  Cardiovascular: Normal rate and regular rhythm.  Pulmonary/Chest: Breath sounds normal. No respiratory distress. She has no wheezes.  Abdominal: Soft. Bowel sounds are normal. There is no tenderness.  Musculoskeletal: She exhibits no edema or tenderness.  Lymphadenopathy:    She has no cervical adenopathy.  Skin: No rash noted. No erythema.  Psychiatric: She has a normal mood and affect. Her behavior is normal.     BP 128/80 (BP Location: Left Arm, Patient Position: Sitting, Cuff Size: Normal)   Pulse 69   Temp 98.1 F (36.7 C) (Oral)   Resp 16   Ht 5' 1.81" (1.57 m)   Wt 169 lb 6.4 oz (76.8 kg)   SpO2 97%   BMI 31.17 kg/m  Wt Readings from Last 3 Encounters:  02/24/17 169 lb 6.4 oz (76.8 kg)  11/22/16 175 lb (79.4 kg)  08/13/16 172 lb 6.4 oz (78.2 kg)     Lab Results  Component Value Date   WBC 5.1 11/22/2016   HGB 14.6 11/22/2016   HCT 43.3 11/22/2016   PLT 305.0 11/22/2016   GLUCOSE 107 (H) 11/22/2016   CHOL 194 02/16/2016   TRIG 98.0 02/16/2016   HDL 67.90 02/16/2016   LDLDIRECT 122.8 07/24/2012   LDLCALC 107 (H) 02/16/2016   ALT 13 06/10/2016   AST 14 06/10/2016   NA 136 11/22/2016   K 4.2 11/22/2016   CL 99 11/22/2016   CREATININE 0.65 11/22/2016   BUN 12 11/22/2016   CO2 31 11/22/2016   TSH 3.31 06/10/2016   INR 1.0 11/22/2016   HGBA1C 5.5 02/16/2016   MICROALBUR <0.7 09/02/2014    Mm Screening Breast Tomo Bilateral  Result Date: 11/30/2016 CLINICAL DATA:  Screening. EXAM: 2D DIGITAL SCREENING BILATERAL MAMMOGRAM WITH CAD AND ADJUNCT TOMO COMPARISON:  Previous exam(s). ACR Breast Density Category b: There are scattered areas of fibroglandular density. FINDINGS: There are no findings suspicious for malignancy. Images were processed with CAD. IMPRESSION: No mammographic evidence of malignancy. A result letter of this screening mammogram will be mailed directly to the patient. RECOMMENDATION: Screening mammogram in one year. (Code:SM-B-01Y) BI-RADS CATEGORY  1: Negative. Electronically Signed   By: Ammie Ferrier M.D.   On: 11/30/2016 10:28       Assessment & Plan:   Problem List Items Addressed This Visit    Essential hypertension, benign (Chronic)    Blood pressures as outlined.  Overall appear to be doing well on current regimen.  Follow pressures.  Follow metabolic panel.        Relevant Medications   losartan-hydrochlorothiazide (HYZAAR) 50-12.5 MG  tablet   metoprolol succinate (TOPROL-XL) 100 MG 24 hr tablet   Other Relevant Orders   Comprehensive metabolic panel   TSH   Stress    Increased stress - doing better.  Follow.  No further intervention warranted.         Other Visit Diagnoses    Screening cholesterol level    -  Primary   Relevant Orders   Lipid panel       Einar Pheasant, MD

## 2017-02-27 ENCOUNTER — Encounter: Payer: Self-pay | Admitting: Internal Medicine

## 2017-02-27 NOTE — Assessment & Plan Note (Signed)
Blood pressures as outlined.  Overall appear to be doing well on current regimen.  Follow pressures.  Follow metabolic panel.

## 2017-02-27 NOTE — Assessment & Plan Note (Signed)
Increased stress - doing better.  Follow.  No further intervention warranted.

## 2017-04-22 ENCOUNTER — Other Ambulatory Visit (INDEPENDENT_AMBULATORY_CARE_PROVIDER_SITE_OTHER): Payer: 59

## 2017-04-22 DIAGNOSIS — I10 Essential (primary) hypertension: Secondary | ICD-10-CM

## 2017-04-22 DIAGNOSIS — Z13220 Encounter for screening for lipoid disorders: Secondary | ICD-10-CM

## 2017-04-22 LAB — COMPREHENSIVE METABOLIC PANEL
ALBUMIN: 3.9 g/dL (ref 3.5–5.2)
ALK PHOS: 65 U/L (ref 39–117)
ALT: 15 U/L (ref 0–35)
AST: 16 U/L (ref 0–37)
BUN: 18 mg/dL (ref 6–23)
CO2: 31 mEq/L (ref 19–32)
CREATININE: 0.65 mg/dL (ref 0.40–1.20)
Calcium: 9 mg/dL (ref 8.4–10.5)
Chloride: 102 mEq/L (ref 96–112)
GFR: 99.47 mL/min (ref >60.00)
GLUCOSE: 103 mg/dL — AB (ref 70–99)
Potassium: 3.9 mEq/L (ref 3.5–5.1)
SODIUM: 140 meq/L (ref 135–145)
TOTAL PROTEIN: 7.1 g/dL (ref 6.0–8.3)
Total Bilirubin: 0.5 mg/dL (ref 0.2–1.2)

## 2017-04-22 LAB — LIPID PANEL
CHOLESTEROL: 175 mg/dL (ref 0–200)
HDL: 68.1 mg/dL (ref >39.00)
LDL Cholesterol: 93 mg/dL (ref 0–99)
NonHDL: 107.18
Total CHOL/HDL Ratio: 3
Triglycerides: 72 mg/dL (ref 0.0–149.0)
VLDL: 14.4 mg/dL (ref 0.0–40.0)

## 2017-04-22 LAB — TSH: TSH: 3.64 u[IU]/mL (ref 0.35–4.50)

## 2017-04-24 ENCOUNTER — Encounter: Payer: Self-pay | Admitting: Internal Medicine

## 2017-04-28 DIAGNOSIS — E669 Obesity, unspecified: Secondary | ICD-10-CM | POA: Insufficient documentation

## 2017-04-28 DIAGNOSIS — M1712 Unilateral primary osteoarthritis, left knee: Secondary | ICD-10-CM | POA: Insufficient documentation

## 2017-07-07 ENCOUNTER — Encounter: Payer: Self-pay | Admitting: Internal Medicine

## 2017-07-25 ENCOUNTER — Ambulatory Visit (INDEPENDENT_AMBULATORY_CARE_PROVIDER_SITE_OTHER): Payer: 59 | Admitting: Internal Medicine

## 2017-07-25 ENCOUNTER — Encounter: Payer: Self-pay | Admitting: Internal Medicine

## 2017-07-25 VITALS — BP 126/82 | HR 70 | Temp 97.8°F | Resp 18 | Ht 62.0 in | Wt 175.4 lb

## 2017-07-25 DIAGNOSIS — Z0000 Encounter for general adult medical examination without abnormal findings: Secondary | ICD-10-CM

## 2017-07-25 DIAGNOSIS — I10 Essential (primary) hypertension: Secondary | ICD-10-CM | POA: Diagnosis not present

## 2017-07-25 DIAGNOSIS — F439 Reaction to severe stress, unspecified: Secondary | ICD-10-CM | POA: Diagnosis not present

## 2017-07-25 NOTE — Assessment & Plan Note (Signed)
Blood pressure under good control.  Continue same medication regimen.  Follow pressures.  Follow metabolic panel.   

## 2017-07-25 NOTE — Assessment & Plan Note (Signed)
Handling stress.  Follow.   

## 2017-07-25 NOTE — Progress Notes (Signed)
Patient ID: Kristy Vega, female   DOB: [birth year], 59 y.o.   MRN: [id number]   Subjective:    Patient ID: Kristy Vega, female    DOB: Apr 30, 1960, 57 y.o.   MRN: [id number]  HPI  Patient here her physical exam.  She reports she is doing well.  Feels good.  Trying to stay active.  Is not exercising as much.  Plans to start exercising more.  Discussed diet and exercise.  No chest pain.  No sob.  No acid reflux.  No abdominal pain.  Bowels moving.     Past Medical History:  Diagnosis Date  . History of colon polyps   . Vaginal delivery    X 2   Past Surgical History:  Procedure Laterality Date  . ABDOMINAL HYSTERECTOMY     Dr Josefa Half, for menorrhagia  . CHOLECYSTECTOMY    . INCISION / DRAINAGE HAND / FINGER     removal of sewing needle   Family History  Problem Relation Age of Onset  . Hypertension Mother   . Cancer Father        prostate  . Stroke Maternal Grandfather   . Breast cancer Neg Hx    Social History   Socioeconomic History  . Marital status: Married    Spouse name: Not on file  . Number of children: Not on file  . Years of education: Not on file  . Highest education level: Not on file  Occupational History  . Not on file  Social Needs  . Financial resource strain: Not on file  . Food insecurity:    Worry: Not on file    Inability: Not on file  . Transportation needs:    Medical: Not on file    Non-medical: Not on file  Tobacco Use  . Smoking status: Never Smoker  . Smokeless tobacco: Never Used  Substance and Sexual Activity  . Alcohol use: Yes    Alcohol/week: 0.0 oz    Comment: occasional  . Drug use: Not on file  . Sexual activity: Not on file  Lifestyle  . Physical activity:    Days per week: Not on file    Minutes per session: Not on file  . Stress: Not on file  Relationships  . Social connections:    Talks on phone: Not on file    Gets together: Not on file    Attends religious service: Not on file    Active member of club or  organization: Not on file    Attends meetings of clubs or organizations: Not on file    Relationship status: Not on file  Other Topics Concern  . Not on file  Social History Narrative   Exercises regularly      Animals: Dogs and horses    Outpatient Encounter Medications as of 07/25/2017  Medication Sig  . aspirin 81 MG tablet Take 1 tablet (81 mg total) by mouth daily.  Marland Kitchen losartan-hydrochlorothiazide (HYZAAR) 50-12.5 MG tablet Take 1 tablet daily by mouth.  . metoprolol succinate (TOPROL-XL) 100 MG 24 hr tablet Take 1 tablet (100 mg total) daily by mouth. Take with or immediately following a meal.  . Multiple Vitamin (MULTIVITAMIN) tablet Take 1 tablet by mouth daily.   No facility-administered encounter medications on file as of 07/25/2017.     Review of Systems  Constitutional: Negative for appetite change and unexpected weight change.  HENT: Negative for congestion and sinus pressure.   Eyes: Negative for pain and visual disturbance.  Respiratory: Negative for cough, chest tightness and shortness of breath.   Cardiovascular: Negative for chest pain, palpitations and leg swelling.  Gastrointestinal: Negative for abdominal pain, diarrhea, nausea and vomiting.  Genitourinary: Negative for difficulty urinating and dysuria.  Musculoskeletal: Negative for joint swelling and myalgias.  Skin: Negative for color change and rash.  Neurological: Negative for dizziness, light-headedness and headaches.  Hematological: Negative for adenopathy. Does not bruise/bleed easily.  Psychiatric/Behavioral: Negative for agitation and dysphoric mood.       Objective:    Physical Exam  Constitutional: She is oriented to person, place, and time. She appears well-developed and well-nourished. No distress.  HENT:  Nose: Nose normal.  Mouth/Throat: Oropharynx is clear and moist.  Eyes: Right eye exhibits no discharge. Left eye exhibits no discharge. No scleral icterus.  Neck: Neck supple. No  thyromegaly present.  Cardiovascular: Normal rate and regular rhythm.  Pulmonary/Chest: Breath sounds normal. No accessory muscle usage. No tachypnea. No respiratory distress. She has no decreased breath sounds. She has no wheezes. She has no rhonchi. Right breast exhibits no inverted nipple, no mass, no nipple discharge and no tenderness (no axillary adenopathy). Left breast exhibits no inverted nipple, no mass, no nipple discharge and no tenderness (no axilarry adenopathy).  Abdominal: Soft. Bowel sounds are normal. There is no tenderness.  Musculoskeletal: She exhibits no edema or tenderness.  Lymphadenopathy:    She has no cervical adenopathy.  Neurological: She is alert and oriented to person, place, and time.  Skin: Skin is warm. No rash noted. No erythema.  Psychiatric: She has a normal mood and affect. Her behavior is normal.    BP 126/82 (BP Location: Left Arm, Patient Position: Sitting, Cuff Size: Normal)   Pulse 70   Temp 97.8 F (36.6 C) (Oral)   Resp 18   Ht 5\' 2"  (1.575 m)   Wt 175 lb 6.4 oz (79.6 kg)   SpO2 95%   BMI 32.08 kg/m  Wt Readings from Last 3 Encounters:  07/25/17 175 lb 6.4 oz (79.6 kg)  02/24/17 169 lb 6.4 oz (76.8 kg)  11/22/16 175 lb (79.4 kg)     Lab Results  Component Value Date   WBC 5.1 11/22/2016   HGB 14.6 11/22/2016   HCT 43.3 11/22/2016   PLT 305.0 11/22/2016   GLUCOSE 103 (H) 04/22/2017   CHOL 175 04/22/2017   TRIG 72.0 04/22/2017   HDL 68.10 04/22/2017   LDLDIRECT 122.8 07/24/2012   LDLCALC 93 04/22/2017   ALT 15 04/22/2017   AST 16 04/22/2017   NA 140 04/22/2017   K 3.9 04/22/2017   CL 102 04/22/2017   CREATININE 0.65 04/22/2017   BUN 18 04/22/2017   CO2 31 04/22/2017   TSH 3.64 04/22/2017   INR 1.0 11/22/2016   HGBA1C 5.5 02/16/2016   MICROALBUR <0.7 09/02/2014    Mm Screening Breast Tomo Bilateral  Result Date: 11/30/2016 CLINICAL DATA:  Screening. EXAM: 2D DIGITAL SCREENING BILATERAL MAMMOGRAM WITH CAD AND ADJUNCT  TOMO COMPARISON:  Previous exam(s). ACR Breast Density Category b: There are scattered areas of fibroglandular density. FINDINGS: There are no findings suspicious for malignancy. Images were processed with CAD. IMPRESSION: No mammographic evidence of malignancy. A result letter of this screening mammogram will be mailed directly to the patient. RECOMMENDATION: Screening mammogram in one year. (Code:SM-B-01Y) BI-RADS CATEGORY  1: Negative. Electronically Signed   By: Ammie Ferrier M.D.   On: 11/30/2016 10:28       Assessment & Plan:   Problem List  Items Addressed This Visit    Essential hypertension, benign (Chronic)    Blood pressure under good control.  Continue same medication regimen.  Follow pressures.  Follow metabolic panel.        Relevant Orders   Comprehensive metabolic panel   Lipid panel   Health care maintenance    Physical today 07/25/17.  PAP 06/10/16 - negative with negative HPV.  Colonoscopy 05/2010 - three polyps.  Recommended f/u colonoscopy in 10 years.  Mammogram 11/30/16 - Birads I.  She will schedule f/u mammogram.        Stress    Handling stress.  Follow.         Other Visit Diagnoses    Routine general medical examination at a health care facility    -  Primary       Einar Pheasant, MD

## 2017-07-25 NOTE — Assessment & Plan Note (Addendum)
Physical today 07/25/17.  PAP 06/10/16 - negative with negative HPV.  Colonoscopy 05/2010 - three polyps.  Recommended f/u colonoscopy in 10 years.  Mammogram 11/30/16 - Birads I.  She will schedule f/u mammogram.

## 2017-09-12 ENCOUNTER — Other Ambulatory Visit: Payer: Self-pay | Admitting: Internal Medicine

## 2017-09-27 ENCOUNTER — Other Ambulatory Visit (INDEPENDENT_AMBULATORY_CARE_PROVIDER_SITE_OTHER): Payer: 59

## 2017-09-27 DIAGNOSIS — I10 Essential (primary) hypertension: Secondary | ICD-10-CM

## 2017-09-27 LAB — LIPID PANEL
CHOL/HDL RATIO: 3
CHOLESTEROL: 178 mg/dL (ref 0–200)
HDL: 66.3 mg/dL (ref >39.00)
LDL Cholesterol: 87 mg/dL (ref 0–99)
NonHDL: 111.32
TRIGLYCERIDES: 120 mg/dL (ref 0.0–149.0)
VLDL: 24 mg/dL (ref 0.0–40.0)

## 2017-09-27 LAB — COMPREHENSIVE METABOLIC PANEL
ALBUMIN: 3.9 g/dL (ref 3.5–5.2)
ALK PHOS: 63 U/L (ref 39–117)
ALT: 15 U/L (ref 0–35)
AST: 18 U/L (ref 0–37)
BUN: 11 mg/dL (ref 6–23)
CALCIUM: 9.2 mg/dL (ref 8.4–10.5)
CO2: 29 mEq/L (ref 19–32)
CREATININE: 0.59 mg/dL (ref 0.40–1.20)
Chloride: 102 mEq/L (ref 96–112)
GFR: 111.06 mL/min (ref >60.00)
Glucose, Bld: 98 mg/dL (ref 70–99)
Potassium: 3.9 mEq/L (ref 3.5–5.1)
Sodium: 138 mEq/L (ref 135–145)
TOTAL PROTEIN: 7.1 g/dL (ref 6.0–8.3)
Total Bilirubin: 0.6 mg/dL (ref 0.2–1.2)

## 2017-09-28 ENCOUNTER — Encounter: Payer: Self-pay | Admitting: Internal Medicine

## 2017-11-11 ENCOUNTER — Other Ambulatory Visit: Payer: Self-pay | Admitting: Internal Medicine

## 2018-01-03 ENCOUNTER — Other Ambulatory Visit: Payer: Self-pay | Admitting: Internal Medicine

## 2018-01-03 DIAGNOSIS — Z1231 Encounter for screening mammogram for malignant neoplasm of breast: Secondary | ICD-10-CM

## 2018-01-11 ENCOUNTER — Ambulatory Visit
Admission: RE | Admit: 2018-01-11 | Discharge: 2018-01-11 | Disposition: A | Discharge status: Home / Self Care | Payer: 59 | Source: Ambulatory Visit | Attending: Internal Medicine | Admitting: Internal Medicine

## 2018-01-11 DIAGNOSIS — Z1231 Encounter for screening mammogram for malignant neoplasm of breast: Secondary | ICD-10-CM | POA: Diagnosis not present

## 2018-01-26 ENCOUNTER — Ambulatory Visit (INDEPENDENT_AMBULATORY_CARE_PROVIDER_SITE_OTHER): Payer: 59 | Admitting: Internal Medicine

## 2018-01-26 ENCOUNTER — Encounter: Payer: Self-pay | Admitting: Internal Medicine

## 2018-01-26 DIAGNOSIS — F439 Reaction to severe stress, unspecified: Secondary | ICD-10-CM

## 2018-01-26 DIAGNOSIS — I10 Essential (primary) hypertension: Secondary | ICD-10-CM

## 2018-01-26 DIAGNOSIS — M25521 Pain in right elbow: Secondary | ICD-10-CM | POA: Diagnosis not present

## 2018-01-26 MED ORDER — LOSARTAN POTASSIUM-HCTZ 100-12.5 MG PO TABS: 1.0000 | ORAL_TABLET | Freq: Every day | ORAL | 2 refills | Status: DC

## 2018-01-26 NOTE — Patient Instructions (Addendum)
Elbow strap  Stop losartan/hctz  50/12.5 and start losartan/hctz 100/12.5 - one tablet per day.

## 2018-01-26 NOTE — Progress Notes (Signed)
Patient ID: Kristy Vega, female   DOB: 08-Nov-1960, 57 y.o.   MRN: [id number]   Subjective:    Patient ID: Kristy Vega, female    DOB: 1961/04/12, 56 y.o.   MRN: [id number]  HPI  Patient here for a scheduled follow up.  She reports she is doing relatively well.  Trying to stay active.  No chest pain.  No sob.  No acid reflux. No abdominal pain.  Bowels moving.  Has gained weight.  Has not been watching her diet as well.  No exercising as much.  Increased pain right elbow.  Has been doing a lot of quilting.  Handling stress.     Past Medical History:  Diagnosis Date  . History of colon polyps   . Vaginal delivery    X 2   Past Surgical History:  Procedure Laterality Date  . ABDOMINAL HYSTERECTOMY     Dr Josefa Half, for menorrhagia  . CHOLECYSTECTOMY    . INCISION / DRAINAGE HAND / FINGER     removal of sewing needle   Family History  Problem Relation Age of Onset  . Hypertension Mother   . Cancer Father        prostate  . Stroke Maternal Grandfather   . Breast cancer Neg Hx    Social History   Socioeconomic History  . Marital status: Married    Spouse name: Not on file  . Number of children: Not on file  . Years of education: Not on file  . Highest education level: Not on file  Occupational History  . Not on file  Social Needs  . Financial resource strain: Not on file  . Food insecurity:    Worry: Not on file    Inability: Not on file  . Transportation needs:    Medical: Not on file    Non-medical: Not on file  Tobacco Use  . Smoking status: Never Smoker  . Smokeless tobacco: Never Used  Substance and Sexual Activity  . Alcohol use: Yes    Alcohol/week: 0.0 standard drinks    Comment: occasional  . Drug use: Not on file  . Sexual activity: Not on file  Lifestyle  . Physical activity:    Days per week: Not on file    Minutes per session: Not on file  . Stress: Not on file  Relationships  . Social connections:    Talks on phone: Not on file    Gets  together: Not on file    Attends religious service: Not on file    Active member of club or organization: Not on file    Attends meetings of clubs or organizations: Not on file    Relationship status: Not on file  Other Topics Concern  . Not on file  Social History Narrative   Exercises regularly      Animals: Dogs and horses    Outpatient Encounter Medications as of 01/26/2018  Medication Sig  . aspirin 81 MG tablet Take 1 tablet (81 mg total) by mouth daily.  Marland Kitchen losartan-hydrochlorothiazide (HYZAAR) 100-12.5 MG tablet Take 1 tablet by mouth daily.  . metoprolol succinate (TOPROL-XL) 100 MG 24 hr tablet TAKE 1 TABLET BY MOUTH ONCE DAILY. TAKE WITH OR IMMEDIATELY FOLLOWING A MEAL.  . Multiple Vitamin (MULTIVITAMIN) tablet Take 1 tablet by mouth daily.  . [DISCONTINUED] losartan-hydrochlorothiazide (HYZAAR) 50-12.5 MG tablet TAKE 1 TABLET BY MOUTH ONCE DAILY   No facility-administered encounter medications on file as of 01/26/2018.  Review of Systems  Constitutional: Negative for appetite change and unexpected weight change.  HENT: Negative for congestion and sinus pressure.   Respiratory: Negative for cough, chest tightness and shortness of breath.   Cardiovascular: Negative for chest pain, palpitations and leg swelling.  Gastrointestinal: Negative for abdominal pain, diarrhea, nausea and vomiting.  Genitourinary: Negative for difficulty urinating and dysuria.  Musculoskeletal: Negative for myalgias.       Right elbow pain as outlined.    Skin: Negative for color change and rash.  Neurological: Negative for dizziness, light-headedness and headaches.  Psychiatric/Behavioral: Negative for agitation and dysphoric mood.       Objective:    Physical Exam  Constitutional: She is oriented to person, place, and time. She appears well-developed and well-nourished. No distress.  HENT:  Nose: Nose normal.  Mouth/Throat: Oropharynx is clear and moist.  Eyes: Right eye exhibits no  discharge. Left eye exhibits no discharge. No scleral icterus.  Neck: Neck supple. No thyromegaly present.  Cardiovascular: Normal rate and regular rhythm.  Pulmonary/Chest: Breath sounds normal. No accessory muscle usage. No tachypnea. No respiratory distress. She has no decreased breath sounds. She has no wheezes. She has no rhonchi. Right breast exhibits no inverted nipple, no mass, no nipple discharge and no tenderness (no axillary adenopathy). Left breast exhibits no inverted nipple, no mass, no nipple discharge and no tenderness (no axilarry adenopathy).  Abdominal: Soft. Bowel sounds are normal. There is no tenderness.  Musculoskeletal: She exhibits no edema or tenderness.  Lymphadenopathy:    She has no cervical adenopathy.  Neurological: She is alert and oriented to person, place, and time.  Skin: No rash noted. No erythema.  Psychiatric: She has a normal mood and affect. Her behavior is normal.    BP 128/78 (BP Location: Left Arm, Patient Position: Sitting, Cuff Size: Normal)   Pulse 83   Temp 98.3 F (36.8 C) (Oral)   Resp 18   Wt 181 lb (82.1 kg)   SpO2 96%   BMI 33.11 kg/m  Wt Readings from Last 3 Encounters:  01/26/18 181 lb (82.1 kg)  07/25/17 175 lb 6.4 oz (79.6 kg)  02/24/17 169 lb 6.4 oz (76.8 kg)     Lab Results  Component Value Date   WBC 5.1 11/22/2016   HGB 14.6 11/22/2016   HCT 43.3 11/22/2016   PLT 305.0 11/22/2016   GLUCOSE 98 09/27/2017   CHOL 178 09/27/2017   TRIG 120.0 09/27/2017   HDL 66.30 09/27/2017   LDLDIRECT 122.8 07/24/2012   LDLCALC 87 09/27/2017   ALT 15 09/27/2017   AST 18 09/27/2017   NA 138 09/27/2017   K 3.9 09/27/2017   CL 102 09/27/2017   CREATININE 0.59 09/27/2017   BUN 11 09/27/2017   CO2 29 09/27/2017   TSH 3.64 04/22/2017   INR 1.0 11/22/2016   HGBA1C 5.5 02/16/2016   MICROALBUR <0.7 09/02/2014    Mm 3d Screen Breast Bilateral  Result Date: 01/12/2018 CLINICAL DATA:  Screening. EXAM: DIGITAL SCREENING BILATERAL  MAMMOGRAM WITH TOMO AND CAD COMPARISON:  Previous exam(s). ACR Breast Density Category c: The breast tissue is heterogeneously dense, which may obscure small masses. FINDINGS: There are no findings suspicious for malignancy. Images were processed with CAD. IMPRESSION: No mammographic evidence of malignancy. A result letter of this screening mammogram will be mailed directly to the patient. RECOMMENDATION: Screening mammogram in one year. (Code:SM-B-01Y) BI-RADS CATEGORY  1: Negative. Electronically Signed   By: Lovey Newcomer M.D.   On: 01/12/2018 08:59  Assessment & Plan:   Problem List Items Addressed This Visit    Essential hypertension, benign (Chronic)    Blood pressure under good control.  Continue same medication regimen.  Follow pressures.  Follow metabolic panel.        Relevant Medications   losartan-hydrochlorothiazide (HYZAAR) 100-12.5 MG tablet   Right elbow pain    Right elbow pain.  Question - tendinitis.  Elbow strap.  Notify me if persistent.        Stress    Handling stress.  Does not feel needs anything more at this time.  Follow.            Einar Pheasant, MD

## 2018-01-29 ENCOUNTER — Encounter: Payer: Self-pay | Admitting: Internal Medicine

## 2018-01-29 DIAGNOSIS — M25521 Pain in right elbow: Secondary | ICD-10-CM | POA: Insufficient documentation

## 2018-01-29 NOTE — Assessment & Plan Note (Signed)
Blood pressure under good control.  Continue same medication regimen.  Follow pressures.  Follow metabolic panel.   

## 2018-01-29 NOTE — Assessment & Plan Note (Signed)
Right elbow pain.  Question - tendinitis.  Elbow strap.  Notify me if persistent.

## 2018-01-29 NOTE — Assessment & Plan Note (Signed)
Handling stress.  Does not feel needs anything more at this time.  Follow.   

## 2018-02-23 DIAGNOSIS — H2513 Age-related nuclear cataract, bilateral: Secondary | ICD-10-CM | POA: Diagnosis not present

## 2018-03-20 ENCOUNTER — Encounter: Payer: Self-pay | Admitting: Internal Medicine

## 2018-03-20 NOTE — Telephone Encounter (Signed)
Notify pt that overall pressures ok  I would like to have her to continue to monitor her pressures.  Send in readings.  Will need to reschedule f/u appt.  Also, need to confirm which medications she needs refilled.  Ok to refill for 3 months.

## 2018-03-20 NOTE — Telephone Encounter (Signed)
LMTCB

## 2018-03-21 ENCOUNTER — Ambulatory Visit: Payer: 59 | Admitting: Internal Medicine

## 2018-03-30 ENCOUNTER — Telehealth: Payer: Self-pay | Admitting: Internal Medicine

## 2018-03-30 NOTE — Telephone Encounter (Signed)
Pt dropped off papers that need to be completed by Dr. Nicki Reaper for pt's  life insurance.Papers are up front in color folder.

## 2018-03-31 NOTE — Telephone Encounter (Signed)
Received paperwork. Told patient that Dr Nicki Reaper and myself will be out of the office next week. Patient advised that paper work can wait until after the holidays. It is due January 9.

## 2018-03-31 NOTE — Telephone Encounter (Signed)
Noted  

## 2018-04-17 ENCOUNTER — Other Ambulatory Visit: Payer: Self-pay | Admitting: Internal Medicine

## 2018-04-17 ENCOUNTER — Other Ambulatory Visit: Payer: Self-pay

## 2018-04-17 ENCOUNTER — Encounter: Payer: Self-pay | Admitting: Internal Medicine

## 2018-04-17 MED ORDER — LOSARTAN POTASSIUM-HCTZ 100-12.5 MG PO TABS: 1.0000 | ORAL_TABLET | Freq: Every day | ORAL | 0 refills | Status: DC

## 2018-04-18 ENCOUNTER — Encounter: Payer: Self-pay | Admitting: Internal Medicine

## 2018-05-15 ENCOUNTER — Other Ambulatory Visit: Payer: Self-pay | Admitting: Internal Medicine

## 2018-06-22 IMAGING — DX DG LUMBAR SPINE 2-3V
3 series · 3 of 3 positions shown · non-contrast
Comparison: Lumbar spine films of 03/13/2015

CLINICAL DATA: Left leg pain

EXAM:
LUMBAR SPINE - 2-3 VIEW

[lumbar spine ap]
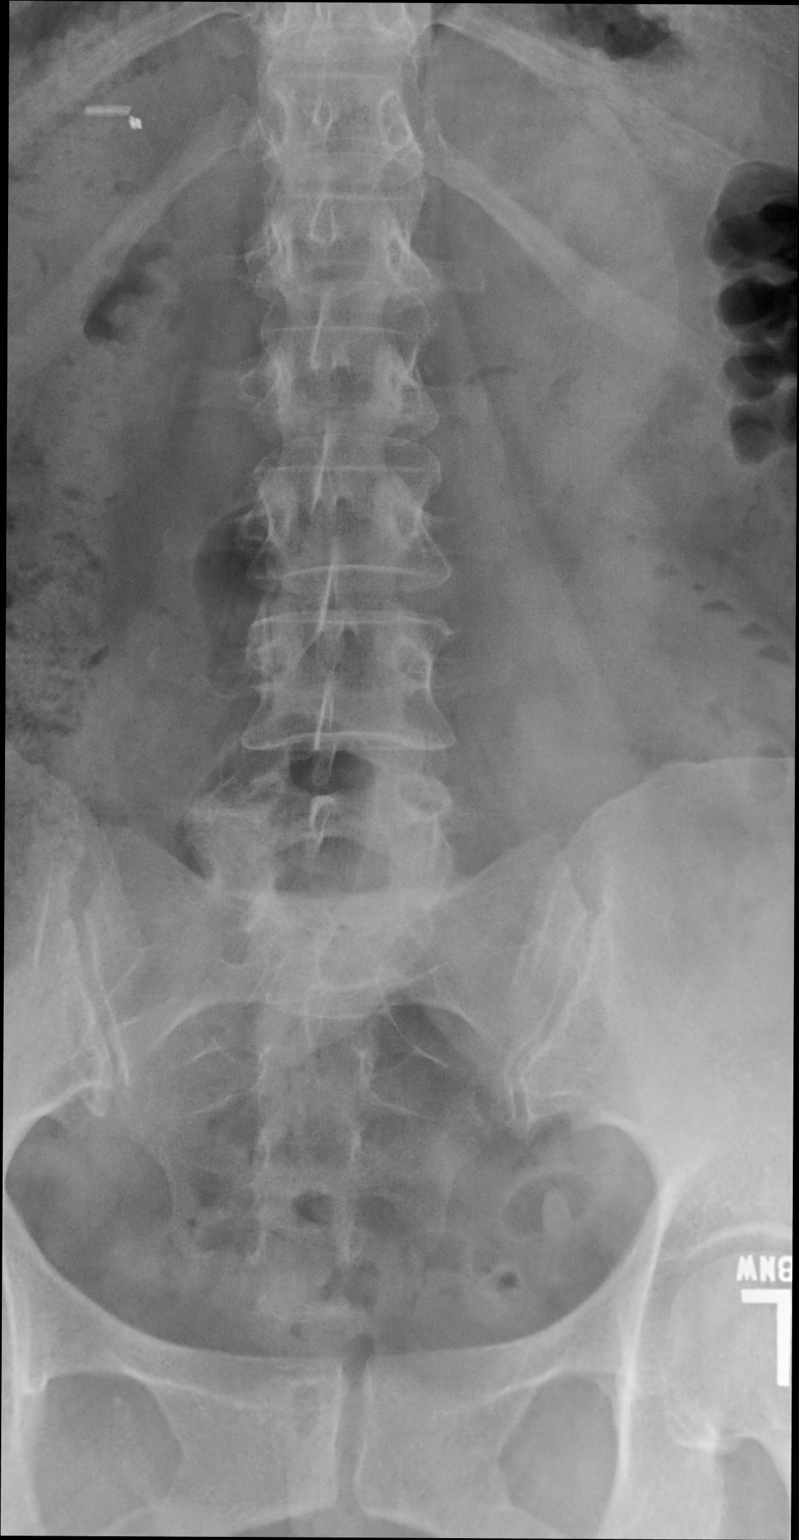

[lumbar spine lat]
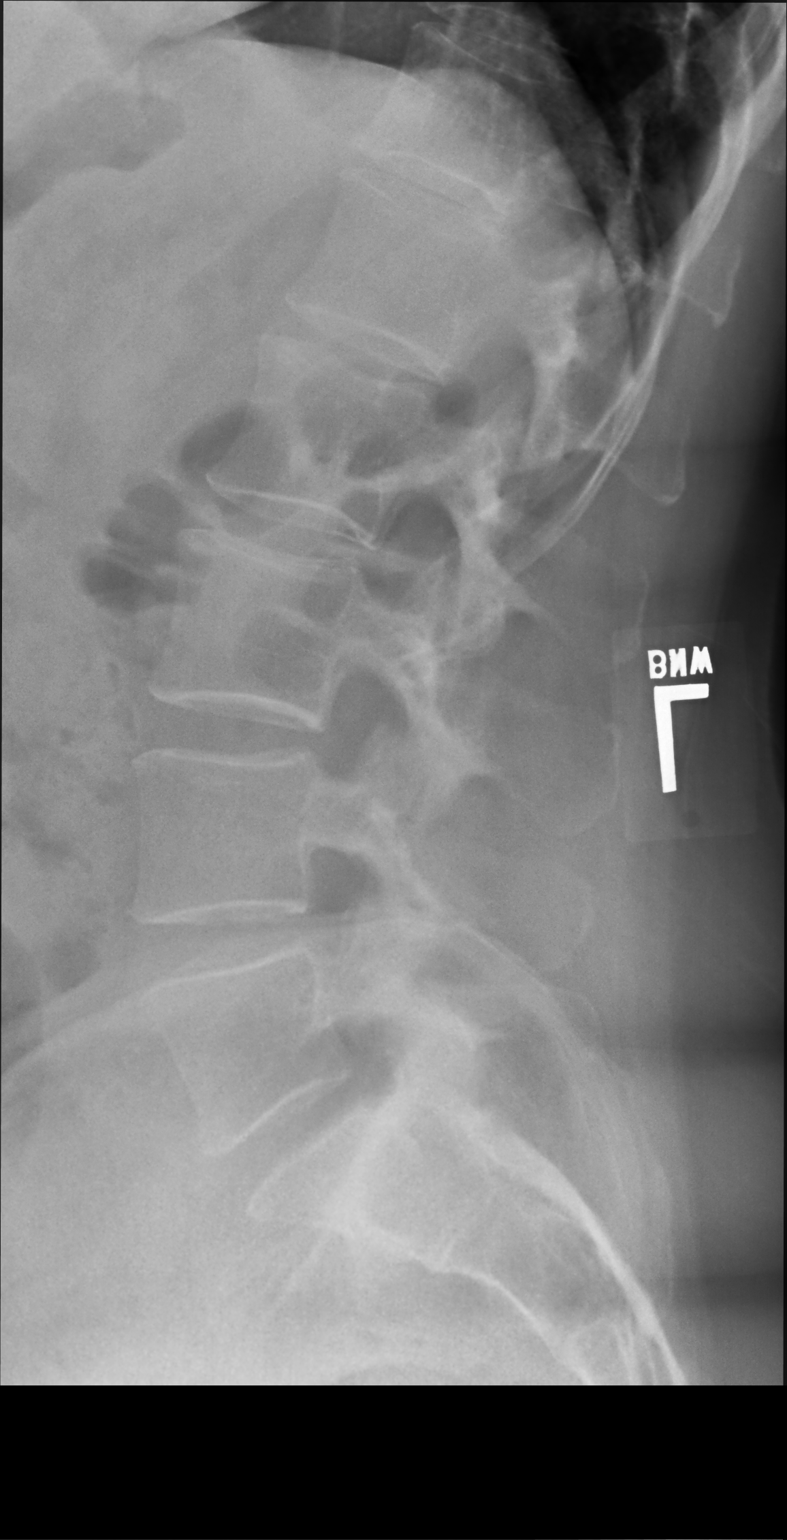

[lumbar spot lat]
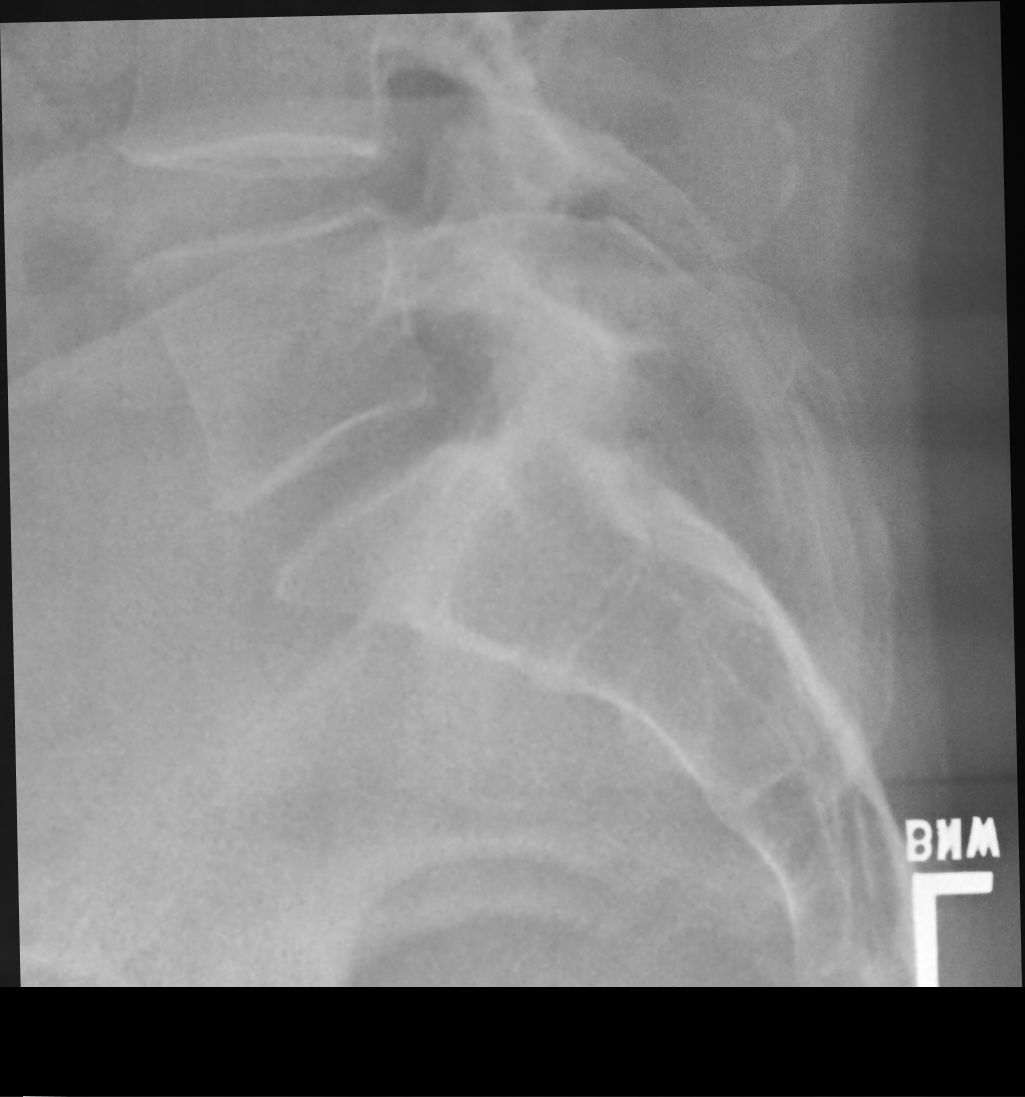

[3 of 3 positions shown; findings below may reference images not displayed]

FINDINGS: The lumbar vertebrae are in normal alignment. Intervertebral disc
spaces appear normal. No compression deformity is seen. The SI
joints are corticated.
IMPRESSION: Normal alignment.  Normal intervertebral disc spaces.

## 2018-06-25 ENCOUNTER — Encounter: Payer: Self-pay | Admitting: Internal Medicine

## 2018-06-28 NOTE — Telephone Encounter (Signed)
Ok to hold on f/u appt.  Agree with having her spot check her pressure and send in.   Ok to schedule her for her physical in 1-2 months.

## 2018-06-29 ENCOUNTER — Ambulatory Visit: Payer: 59 | Admitting: Internal Medicine

## 2018-07-03 ENCOUNTER — Other Ambulatory Visit: Payer: Self-pay | Admitting: Internal Medicine

## 2018-07-04 MED FILL — HYDROCHLOROTHIAZIDE 12.5 MG: 12.5 | 90 days supply | Qty: 90 | Fill #0

## 2018-07-04 MED FILL — LOSARTAN POTASSIUM 100 MG T: 100 | 90 days supply | Qty: 90 | Fill #0

## 2018-07-10 NOTE — Telephone Encounter (Signed)
Pt aware and scheduled. Scheduled for labs as well

## 2018-08-07 MED FILL — METOPROLOL SUCCINATE ER 100: 100 | 90 days supply | Qty: 90 | Fill #0

## 2018-08-17 ENCOUNTER — Encounter: Payer: Self-pay | Admitting: Internal Medicine

## 2018-08-18 ENCOUNTER — Other Ambulatory Visit: Payer: Self-pay | Admitting: Internal Medicine

## 2018-08-18 ENCOUNTER — Telehealth: Payer: Self-pay | Admitting: Radiology

## 2018-08-18 DIAGNOSIS — I10 Essential (primary) hypertension: Secondary | ICD-10-CM

## 2018-08-18 NOTE — Progress Notes (Signed)
Orders placed for f/u labs.  

## 2018-08-18 NOTE — Telephone Encounter (Signed)
Orders placed for f/u labs.  

## 2018-08-18 NOTE — Telephone Encounter (Signed)
Pt coming in for labs Monday, please place future orders. Thank you 

## 2018-08-21 ENCOUNTER — Other Ambulatory Visit: Payer: Self-pay

## 2018-08-21 ENCOUNTER — Other Ambulatory Visit (INDEPENDENT_AMBULATORY_CARE_PROVIDER_SITE_OTHER): Payer: 59

## 2018-08-21 DIAGNOSIS — I10 Essential (primary) hypertension: Secondary | ICD-10-CM | POA: Diagnosis not present

## 2018-08-21 LAB — COMPREHENSIVE METABOLIC PANEL
ALT: 15 U/L (ref 0–35)
AST: 17 U/L (ref 0–37)
Albumin: 4 g/dL (ref 3.5–5.2)
Alkaline Phosphatase: 79 U/L (ref 39–117)
BUN: 11 mg/dL (ref 6–23)
CO2: 28 mEq/L (ref 19–32)
Calcium: 8.8 mg/dL (ref 8.4–10.5)
Chloride: 102 mEq/L (ref 96–112)
Creatinine, Ser: 0.68 mg/dL (ref 0.40–1.20)
GFR: 88.43 mL/min (ref >60.00)
Glucose, Bld: 103 mg/dL — ABNORMAL HIGH (ref 70–99)
Potassium: 4.2 mEq/L (ref 3.5–5.1)
Sodium: 139 mEq/L (ref 135–145)
Total Bilirubin: 0.6 mg/dL (ref 0.2–1.2)
Total Protein: 7 g/dL (ref 6.0–8.3)

## 2018-08-21 LAB — CBC WITH DIFFERENTIAL/PLATELET
Basophils Absolute: 0.1 10*3/uL (ref 0.0–0.1)
Basophils Relative: 1 % (ref 0.0–3.0)
Eosinophils Absolute: 0.2 10*3/uL (ref 0.0–0.7)
Eosinophils Relative: 3.2 % (ref 0.0–5.0)
HCT: 42.7 % (ref 36.0–46.0)
Hemoglobin: 14.6 g/dL (ref 12.0–15.0)
Lymphocytes Relative: 37.2 % (ref 12.0–46.0)
Lymphs Abs: 2.1 10*3/uL (ref 0.7–4.0)
MCHC: 34.3 g/dL (ref 30.0–36.0)
MCV: 96.4 fl (ref 78.0–100.0)
Monocytes Absolute: 0.4 10*3/uL (ref 0.1–1.0)
Monocytes Relative: 6.4 % (ref 3.0–12.0)
Neutro Abs: 2.9 10*3/uL (ref 1.4–7.7)
Neutrophils Relative %: 52.2 % (ref 43.0–77.0)
Platelets: 276 10*3/uL (ref 150.0–400.0)
RBC: 4.43 Mil/uL (ref 3.87–5.11)
RDW: 12.4 % (ref 11.5–15.5)
WBC: 5.5 10*3/uL (ref 4.0–10.5)

## 2018-08-21 LAB — LIPID PANEL
Cholesterol: 192 mg/dL (ref 0–200)
HDL: 68.9 mg/dL (ref >39.00)
LDL Cholesterol: 104 mg/dL — ABNORMAL HIGH (ref 0–99)
NonHDL: 123.13
Total CHOL/HDL Ratio: 3
Triglycerides: 94 mg/dL (ref 0.0–149.0)
VLDL: 18.8 mg/dL (ref 0.0–40.0)

## 2018-08-21 LAB — TSH: TSH: 2.99 u[IU]/mL (ref 0.35–4.50)

## 2018-08-22 ENCOUNTER — Encounter: Payer: Self-pay | Admitting: Internal Medicine

## 2018-08-23 ENCOUNTER — Telehealth: Payer: Self-pay | Admitting: *Deleted

## 2018-08-23 ENCOUNTER — Other Ambulatory Visit: Payer: Self-pay

## 2018-08-23 NOTE — Telephone Encounter (Signed)
Copied from [place] [phone]. Topic: General - Other >> Aug 22, 2018  4:51 PM Keene Breath wrote: Reason for CRM: Patient is returning a call to the nurse.  Please call patient back at [phone]

## 2018-08-23 NOTE — Telephone Encounter (Signed)
Spoke with pt. Nothing else needed. Has appt tomorrow.

## 2018-08-24 ENCOUNTER — Encounter: Payer: Self-pay | Admitting: Internal Medicine

## 2018-08-24 ENCOUNTER — Ambulatory Visit (INDEPENDENT_AMBULATORY_CARE_PROVIDER_SITE_OTHER): Payer: 59 | Admitting: Internal Medicine

## 2018-08-24 ENCOUNTER — Other Ambulatory Visit: Payer: Self-pay

## 2018-08-24 DIAGNOSIS — I10 Essential (primary) hypertension: Secondary | ICD-10-CM

## 2018-08-24 DIAGNOSIS — F439 Reaction to severe stress, unspecified: Secondary | ICD-10-CM

## 2018-08-24 DIAGNOSIS — M25521 Pain in right elbow: Secondary | ICD-10-CM

## 2018-08-24 DIAGNOSIS — Z0000 Encounter for general adult medical examination without abnormal findings: Secondary | ICD-10-CM

## 2018-08-24 NOTE — Assessment & Plan Note (Signed)
Resolved

## 2018-08-24 NOTE — Assessment & Plan Note (Signed)
Physical today 08/24/18.  PAP 06/10/16 - negative with negative HPV.  Colonoscopy 2012 - three polyps.  Recommended f/u in 10 years.  Mammogram 01/12/18 - Birads I.

## 2018-08-24 NOTE — Progress Notes (Signed)
Patient ID: Kristy Vega, female   DOB: [birth year], 58 y.o.   MRN: [id number]   Subjective:    Patient ID: Kristy Vega, female    DOB: Oct 13, 1960, 58 y.o.   MRN: [id number]  HPI  Patient here for her physical exam.  She reports she is doing well. Working.  Practicing social distancing.  No fever.  No cough or congestion.  No sob.  No chest pain.  No acid reflux.  No abdominal pain.  Bowels moving.  No urine change.  Elbow is better.  Discussed labs. Discussed diet and exercise.  She is handling stress well.  Blood pressure ok.  Taking medication as prescribed.     Past Medical History:  Diagnosis Date  . History of colon polyps   . Vaginal delivery    X 2   Past Surgical History:  Procedure Laterality Date  . ABDOMINAL HYSTERECTOMY     Dr Josefa Half, for menorrhagia  . CHOLECYSTECTOMY    . INCISION / DRAINAGE HAND / FINGER     removal of sewing needle   Family History  Problem Relation Age of Onset  . Hypertension Mother   . Cancer Father        prostate  . Stroke Maternal Grandfather   . Breast cancer Neg Hx    Social History   Socioeconomic History  . Marital status: Married    Spouse name: Not on file  . Number of children: Not on file  . Years of education: Not on file  . Highest education level: Not on file  Occupational History  . Not on file  Social Needs  . Financial resource strain: Not on file  . Food insecurity:    Worry: Not on file    Inability: Not on file  . Transportation needs:    Medical: Not on file    Non-medical: Not on file  Tobacco Use  . Smoking status: Never Smoker  . Smokeless tobacco: Never Used  Substance and Sexual Activity  . Alcohol use: Yes    Alcohol/week: 0.0 standard drinks    Comment: occasional  . Drug use: Not on file  . Sexual activity: Not on file  Lifestyle  . Physical activity:    Days per week: Not on file    Minutes per session: Not on file  . Stress: Not on file  Relationships  . Social connections:   Talks on phone: Not on file    Gets together: Not on file    Attends religious service: Not on file    Active member of club or organization: Not on file    Attends meetings of clubs or organizations: Not on file    Relationship status: Not on file  Other Topics Concern  . Not on file  Social History Narrative   Exercises regularly      Animals: Dogs and horses    Outpatient Encounter Medications as of 08/24/2018  Medication Sig  . hydrochlorothiazide (MICROZIDE) 12.5 MG capsule TAKE 1 CAPSULE BY MOUTH ONCE DAILY WITH LOSARTAN  . losartan (COZAAR) 100 MG tablet TAKE 1 TABLET BY MOUTH DAILY WITH HCTZ  . metoprolol succinate (TOPROL-XL) 100 MG 24 hr tablet TAKE 1 TABLET BY MOUTH ONCE DAILY. TAKE WITH OR IMMEDIATELY FOLLOWING A MEAL.  . Multiple Vitamin (MULTIVITAMIN) tablet Take 1 tablet by mouth daily.  . [DISCONTINUED] aspirin 81 MG tablet Take 1 tablet (81 mg total) by mouth daily.  . [DISCONTINUED] losartan-hydrochlorothiazide (HYZAAR) 100-12.5 MG tablet TAKE 1  TABLET BY MOUTH DAILY.  . [DISCONTINUED] losartan-hydrochlorothiazide (HYZAAR) 100-12.5 MG tablet Take 1 tablet by mouth daily.   No facility-administered encounter medications on file as of 08/24/2018.     Review of Systems  Constitutional: Negative for appetite change and unexpected weight change.  HENT: Negative for congestion and sinus pressure.   Eyes: Negative for pain and visual disturbance.  Respiratory: Negative for cough, chest tightness and shortness of breath.   Cardiovascular: Negative for chest pain, palpitations and leg swelling.  Gastrointestinal: Negative for abdominal pain, diarrhea, nausea and vomiting.  Genitourinary: Negative for difficulty urinating and dysuria.  Musculoskeletal: Negative for joint swelling.  Skin: Negative for color change and rash.  Neurological: Negative for dizziness, light-headedness and headaches.  Hematological: Negative for adenopathy. Does not bruise/bleed easily.   Psychiatric/Behavioral: Negative for agitation and dysphoric mood.       Objective:    Physical Exam Constitutional:      General: She is not in acute distress.    Appearance: Normal appearance. She is well-developed.  HENT:     Right Ear: Ear canal normal. There is no impacted cerumen.     Left Ear: Ear canal and external ear normal. There is no impacted cerumen.  Eyes:     General: No scleral icterus.       Right eye: No discharge.        Left eye: No discharge.  Neck:     Musculoskeletal: Neck supple. No muscular tenderness.     Thyroid: No thyromegaly.  Cardiovascular:     Rate and Rhythm: Normal rate and regular rhythm.  Pulmonary:     Effort: No tachypnea, accessory muscle usage or respiratory distress.     Breath sounds: Normal breath sounds. No decreased breath sounds or wheezing.  Chest:     Breasts:        Right: No inverted nipple, mass, nipple discharge or tenderness (no axillary adenopathy).        Left: No inverted nipple, mass, nipple discharge or tenderness (no axilarry adenopathy).  Abdominal:     General: Bowel sounds are normal.     Palpations: Abdomen is soft.     Tenderness: There is no abdominal tenderness.  Musculoskeletal:        General: No swelling or tenderness.  Lymphadenopathy:     Cervical: No cervical adenopathy.  Skin:    Findings: No erythema or rash.  Neurological:     Mental Status: She is alert and oriented to person, place, and time.  Psychiatric:        Mood and Affect: Mood normal.        Behavior: Behavior normal.     BP 138/88 (BP Location: Left Arm, Patient Position: Sitting, Cuff Size: Normal)   Pulse 76   Temp 98.4 F (36.9 C) (Oral)   Ht 5\' 1"  (1.549 m)   Wt 180 lb 6.4 oz (81.8 kg)   BMI 34.09 kg/m  Wt Readings from Last 3 Encounters:  08/24/18 180 lb 6.4 oz (81.8 kg)  01/26/18 181 lb (82.1 kg)  07/25/17 175 lb 6.4 oz (79.6 kg)     Lab Results  Component Value Date   WBC 5.5 08/21/2018   HGB 14.6 08/21/2018    HCT 42.7 08/21/2018   PLT 276.0 08/21/2018   GLUCOSE 103 (H) 08/21/2018   CHOL 192 08/21/2018   TRIG 94.0 08/21/2018   HDL 68.90 08/21/2018   LDLDIRECT 122.8 07/24/2012   LDLCALC 104 (H) 08/21/2018   ALT 15 08/21/2018  AST 17 08/21/2018   NA 139 08/21/2018   K 4.2 08/21/2018   CL 102 08/21/2018   CREATININE 0.68 08/21/2018   BUN 11 08/21/2018   CO2 28 08/21/2018   TSH 2.99 08/21/2018   INR 1.0 11/22/2016   HGBA1C 5.5 02/16/2016   MICROALBUR <0.7 09/02/2014    Mm 3d Screen Breast Bilateral  Result Date: 01/12/2018 CLINICAL DATA:  Screening. EXAM: DIGITAL SCREENING BILATERAL MAMMOGRAM WITH TOMO AND CAD COMPARISON:  Previous exam(s). ACR Breast Density Category c: The breast tissue is heterogeneously dense, which may obscure small masses. FINDINGS: There are no findings suspicious for malignancy. Images were processed with CAD. IMPRESSION: No mammographic evidence of malignancy. A result letter of this screening mammogram will be mailed directly to the patient. RECOMMENDATION: Screening mammogram in one year. (Code:SM-B-01Y) BI-RADS CATEGORY  1: Negative. Electronically Signed   By: Lovey Newcomer M.D.   On: 01/12/2018 08:59       Assessment & Plan:   Problem List Items Addressed This Visit    Essential hypertension, benign (Chronic)    Blood pressure as outlined.  Continue current medication regimen.  Follow pressures.  Follow metabolic panel.        Relevant Orders   Lipid panel   Comprehensive metabolic panel   Health care maintenance    Physical today 08/24/18.  PAP 06/10/16 - negative with negative HPV.  Colonoscopy 2012 - three polyps.  Recommended f/u in 10 years.  Mammogram 01/12/18 - Birads I.        Right elbow pain    Resolved.        Stress    Discussed with her today.  Overall handling things well.  Follow.            Einar Pheasant, MD

## 2018-08-24 NOTE — Assessment & Plan Note (Signed)
Discussed with her today.  Overall handling things well.  Follow.

## 2018-08-24 NOTE — Assessment & Plan Note (Signed)
Blood pressure as outlined.  Continue current medication regimen.  Follow pressures.  Follow metabolic panel.  

## 2018-09-18 ENCOUNTER — Other Ambulatory Visit: Payer: Self-pay | Admitting: Internal Medicine

## 2018-09-20 MED ORDER — HYDROCHLOROTHIAZIDE 12.5 MG PO CAPS: 12.5000 mg | ORAL_CAPSULE | Freq: Every day | ORAL | 0 refills | Status: DC

## 2018-09-20 MED FILL — HYDROCHLOROTHIAZIDE 12.5 MG: 12.5 | 90 days supply | Qty: 90 | Fill #0

## 2018-09-22 MED FILL — LOSARTAN POTASSIUM 100 MG T: 100 | 30 days supply | Qty: 30 | Fill #0

## 2018-10-23 MED FILL — LOSARTAN POTASSIUM 100 MG T: 100 | 30 days supply | Qty: 30 | Fill #1

## 2018-11-13 ENCOUNTER — Other Ambulatory Visit: Payer: Self-pay | Admitting: Internal Medicine

## 2018-11-13 MED FILL — METOPROLOL SUCCINATE ER 100: 100 | 90 days supply | Qty: 90 | Fill #0

## 2018-11-16 MED FILL — LOSARTAN POTASSIUM 100 MG T: 100 | 30 days supply | Qty: 30 | Fill #2

## 2018-12-19 ENCOUNTER — Other Ambulatory Visit: Payer: Self-pay | Admitting: Internal Medicine

## 2018-12-20 ENCOUNTER — Other Ambulatory Visit: Payer: Self-pay | Admitting: Internal Medicine

## 2018-12-20 MED FILL — LOSARTAN POTASSIUM 100 MG T: 100 | 30 days supply | Qty: 30 | Fill #0

## 2018-12-22 MED FILL — HYDROCHLOROTHIAZIDE 12.5 MG: 12.5 | 90 days supply | Qty: 90 | Fill #0

## 2018-12-26 ENCOUNTER — Other Ambulatory Visit: Payer: Self-pay

## 2018-12-26 ENCOUNTER — Other Ambulatory Visit (INDEPENDENT_AMBULATORY_CARE_PROVIDER_SITE_OTHER): Payer: 59

## 2018-12-26 ENCOUNTER — Encounter: Payer: Self-pay | Admitting: Internal Medicine

## 2018-12-26 DIAGNOSIS — I10 Essential (primary) hypertension: Secondary | ICD-10-CM

## 2018-12-26 LAB — LIPID PANEL
Cholesterol: 186 mg/dL (ref 0–200)
HDL: 58.8 mg/dL (ref >39.00)
LDL Cholesterol: 102 mg/dL — ABNORMAL HIGH (ref 0–99)
NonHDL: 127.15
Total CHOL/HDL Ratio: 3
Triglycerides: 128 mg/dL (ref 0.0–149.0)
VLDL: 25.6 mg/dL (ref 0.0–40.0)

## 2018-12-26 LAB — COMPREHENSIVE METABOLIC PANEL
ALT: 16 U/L (ref 0–35)
AST: 17 U/L (ref 0–37)
Albumin: 3.8 g/dL (ref 3.5–5.2)
Alkaline Phosphatase: 64 U/L (ref 39–117)
BUN: 16 mg/dL (ref 6–23)
CO2: 28 mEq/L (ref 19–32)
Calcium: 9.3 mg/dL (ref 8.4–10.5)
Chloride: 103 mEq/L (ref 96–112)
Creatinine, Ser: 0.62 mg/dL (ref 0.40–1.20)
GFR: 98.26 mL/min (ref >60.00)
Glucose, Bld: 100 mg/dL — ABNORMAL HIGH (ref 70–99)
Potassium: 3.9 mEq/L (ref 3.5–5.1)
Sodium: 138 mEq/L (ref 135–145)
Total Bilirubin: 0.6 mg/dL (ref 0.2–1.2)
Total Protein: 6.9 g/dL (ref 6.0–8.3)

## 2018-12-28 ENCOUNTER — Other Ambulatory Visit: Payer: Self-pay

## 2018-12-28 ENCOUNTER — Encounter: Payer: Self-pay | Admitting: Internal Medicine

## 2018-12-28 ENCOUNTER — Ambulatory Visit (INDEPENDENT_AMBULATORY_CARE_PROVIDER_SITE_OTHER): Payer: 59 | Admitting: Internal Medicine

## 2018-12-28 VITALS — BP 120/78 | HR 78 | Temp 97.2°F | Ht 62.0 in | Wt 179.6 lb

## 2018-12-28 DIAGNOSIS — Z1239 Encounter for other screening for malignant neoplasm of breast: Secondary | ICD-10-CM | POA: Diagnosis not present

## 2018-12-28 DIAGNOSIS — F439 Reaction to severe stress, unspecified: Secondary | ICD-10-CM | POA: Diagnosis not present

## 2018-12-28 DIAGNOSIS — I10 Essential (primary) hypertension: Secondary | ICD-10-CM

## 2018-12-28 NOTE — Progress Notes (Signed)
Patient ID: Kristy Vega, female   DOB: 08-22-1960, 58 y.o.   MRN: [id number]   Subjective:    Patient ID: Kristy Vega, female    DOB: 08/03/1960, 58 y.o.   MRN: [id number]  HPI  Patient here for a scheduled follow up.  She reports she is doing well.  Increased stress with her work.  Discussed with her today.  She feels she is handling things relatively well.  Does not feel needs any further intervention.  No chest pain.  No sob.  No acid reflux.  No abdominal pain.  Bowels moving.  She reports blood pressure is doing well.     Past Medical History:  Diagnosis Date  . History of colon polyps   . Vaginal delivery    X 2   Past Surgical History:  Procedure Laterality Date  . ABDOMINAL HYSTERECTOMY     Dr Josefa Half, for menorrhagia  . CHOLECYSTECTOMY    . INCISION / DRAINAGE HAND / FINGER     removal of sewing needle   Family History  Problem Relation Age of Onset  . Hypertension Mother   . Cancer Father        prostate  . Stroke Maternal Grandfather   . Breast cancer Neg Hx    Social History   Socioeconomic History  . Marital status: Married    Spouse name: Not on file  . Number of children: Not on file  . Years of education: Not on file  . Highest education level: Not on file  Occupational History  . Not on file  Social Needs  . Financial resource strain: Not on file  . Food insecurity    Worry: Not on file    Inability: Not on file  . Transportation needs    Medical: Not on file    Non-medical: Not on file  Tobacco Use  . Smoking status: Never Smoker  . Smokeless tobacco: Never Used  Substance and Sexual Activity  . Alcohol use: Yes    Alcohol/week: 0.0 standard drinks    Comment: occasional  . Drug use: Not on file  . Sexual activity: Not on file  Lifestyle  . Physical activity    Days per week: Not on file    Minutes per session: Not on file  . Stress: Not on file  Relationships  . Social Herbalist on phone: Not on file    Gets  together: Not on file    Attends religious service: Not on file    Active member of club or organization: Not on file    Attends meetings of clubs or organizations: Not on file    Relationship status: Not on file  Other Topics Concern  . Not on file  Social History Narrative   Exercises regularly      Animals: Dogs and horses    Outpatient Encounter Medications as of 12/28/2018  Medication Sig  . hydrochlorothiazide (MICROZIDE) 12.5 MG capsule TAKE 1 CAPSULE BY MOUTH ONCE DAILY  . losartan (COZAAR) 100 MG tablet TAKE 1 TABLET BY MOUTH DAILY WITH HCTZ  . metoprolol succinate (TOPROL-XL) 100 MG 24 hr tablet TAKE 1 TABLET BY MOUTH ONCE DAILY. TAKE WITH OR IMMEDIATELY FOLLOWING A MEAL.  . Multiple Vitamin (MULTIVITAMIN) tablet Take 1 tablet by mouth daily.   No facility-administered encounter medications on file as of 12/28/2018.     Review of Systems  Constitutional: Negative for appetite change and unexpected weight change.  HENT: Negative for  congestion and sinus pressure.   Respiratory: Negative for cough, chest tightness and shortness of breath.   Cardiovascular: Negative for chest pain, palpitations and leg swelling.  Gastrointestinal: Negative for abdominal pain, diarrhea, nausea and vomiting.  Genitourinary: Negative for difficulty urinating and dysuria.  Musculoskeletal: Negative for joint swelling and myalgias.  Skin: Negative for color change and rash.  Neurological: Negative for dizziness, light-headedness and headaches.  Psychiatric/Behavioral: Negative for agitation and dysphoric mood.       Increased stress as outlined.         Objective:    Physical Exam Constitutional:      General: She is not in acute distress.    Appearance: Normal appearance.  HENT:     Right Ear: External ear normal.     Left Ear: External ear normal.  Eyes:     General: No scleral icterus.       Right eye: No discharge.        Left eye: No discharge.     Conjunctiva/sclera:  Conjunctivae normal.  Neck:     Musculoskeletal: Neck supple. No muscular tenderness.     Thyroid: No thyromegaly.  Cardiovascular:     Rate and Rhythm: Normal rate and regular rhythm.  Pulmonary:     Effort: No respiratory distress.     Breath sounds: Normal breath sounds. No wheezing.  Abdominal:     General: Bowel sounds are normal.     Palpations: Abdomen is soft.     Tenderness: There is no abdominal tenderness.  Musculoskeletal:        General: No swelling or tenderness.  Lymphadenopathy:     Cervical: No cervical adenopathy.  Skin:    Findings: No erythema or rash.  Neurological:     Mental Status: She is alert.  Psychiatric:        Mood and Affect: Mood normal.        Behavior: Behavior normal.     BP 120/78   Pulse 78   Temp (!) 97.2 F (36.2 C) (Temporal)   Ht 5\' 2"  (1.575 m)   Wt 179 lb 9.6 oz (81.5 kg)   SpO2 98%   BMI 32.85 kg/m  Wt Readings from Last 3 Encounters:  12/28/18 179 lb 9.6 oz (81.5 kg)  08/24/18 180 lb 6.4 oz (81.8 kg)  01/26/18 181 lb (82.1 kg)     Lab Results  Component Value Date   WBC 5.5 08/21/2018   HGB 14.6 08/21/2018   HCT 42.7 08/21/2018   PLT 276.0 08/21/2018   GLUCOSE 100 (H) 12/26/2018   CHOL 186 12/26/2018   TRIG 128.0 12/26/2018   HDL 58.80 12/26/2018   LDLDIRECT 122.8 07/24/2012   LDLCALC 102 (H) 12/26/2018   ALT 16 12/26/2018   AST 17 12/26/2018   NA 138 12/26/2018   K 3.9 12/26/2018   CL 103 12/26/2018   CREATININE 0.62 12/26/2018   BUN 16 12/26/2018   CO2 28 12/26/2018   TSH 2.99 08/21/2018   INR 1.0 11/22/2016   HGBA1C 5.5 02/16/2016   MICROALBUR <0.7 09/02/2014    Mm 3d Screen Breast Bilateral  Result Date: 01/12/2018 CLINICAL DATA:  Screening. EXAM: DIGITAL SCREENING BILATERAL MAMMOGRAM WITH TOMO AND CAD COMPARISON:  Previous exam(s). ACR Breast Density Category c: The breast tissue is heterogeneously dense, which may obscure small masses. FINDINGS: There are no findings suspicious for malignancy.  Images were processed with CAD. IMPRESSION: No mammographic evidence of malignancy. A result letter of this screening mammogram will be  mailed directly to the patient. RECOMMENDATION: Screening mammogram in one year. (Code:SM-B-01Y) BI-RADS CATEGORY  1: Negative. Electronically Signed   By: Lovey Newcomer M.D.   On: 01/12/2018 08:59       Assessment & Plan:   Problem List Items Addressed This Visit    Essential hypertension, benign (Chronic)    Blood pressure as outlined.  Continue current medication regimen.  Follow pressures.  Follow metabolic panel.       Stress    Increased stress as outlined.  Discussed with her today.  She does not feel she needs any further intervention.  Follow.         Other Visit Diagnoses    Breast cancer screening    -  Primary   Relevant Orders   MM 3D SCREEN BREAST BILATERAL       Einar Pheasant, MD

## 2018-12-31 ENCOUNTER — Encounter: Payer: Self-pay | Admitting: Internal Medicine

## 2018-12-31 NOTE — Assessment & Plan Note (Signed)
Blood pressure as outlined.  Continue current medication regimen.  Follow pressures.  Follow metabolic panel.  

## 2018-12-31 NOTE — Assessment & Plan Note (Signed)
Increased stress as outlined.  Discussed with her today.  She does not feel she needs any further intervention.  Follow.   

## 2019-01-12 ENCOUNTER — Encounter: Payer: Self-pay | Admitting: Internal Medicine

## 2019-01-18 MED FILL — LOSARTAN POTASSIUM 100 MG T: 100 | 30 days supply | Qty: 30 | Fill #1

## 2019-02-09 MED FILL — METOPROLOL SUCCINATE ER 100: 100 | 90 days supply | Qty: 90 | Fill #1

## 2019-02-12 MED FILL — LOSARTAN POTASSIUM 100 MG T: 100 | 30 days supply | Qty: 30 | Fill #2

## 2019-02-21 ENCOUNTER — Ambulatory Visit
Admission: RE | Admit: 2019-02-21 | Discharge: 2019-02-21 | Disposition: A | Discharge status: Home / Self Care | Payer: 59 | Source: Ambulatory Visit | Attending: Internal Medicine | Admitting: Internal Medicine

## 2019-02-21 DIAGNOSIS — Z1231 Encounter for screening mammogram for malignant neoplasm of breast: Secondary | ICD-10-CM | POA: Diagnosis not present

## 2019-02-21 DIAGNOSIS — Z1239 Encounter for other screening for malignant neoplasm of breast: Secondary | ICD-10-CM

## 2019-03-01 DIAGNOSIS — H524 Presbyopia: Secondary | ICD-10-CM | POA: Diagnosis not present

## 2019-03-24 MED FILL — LOSARTAN POTASSIUM 100 MG T: 100 | 90 days supply | Qty: 90 | Fill #3

## 2019-04-20 ENCOUNTER — Other Ambulatory Visit: Payer: Self-pay | Admitting: Internal Medicine

## 2019-04-20 MED FILL — HYDROCHLOROTHIAZIDE 12.5 MG: 12.5 | 90 days supply | Qty: 90 | Fill #0

## 2019-04-27 ENCOUNTER — Encounter: Payer: Self-pay | Admitting: Internal Medicine

## 2019-05-03 ENCOUNTER — Ambulatory Visit (INDEPENDENT_AMBULATORY_CARE_PROVIDER_SITE_OTHER): Payer: 59 | Admitting: Internal Medicine

## 2019-05-03 ENCOUNTER — Other Ambulatory Visit: Payer: Self-pay

## 2019-05-03 DIAGNOSIS — I10 Essential (primary) hypertension: Secondary | ICD-10-CM

## 2019-05-03 DIAGNOSIS — F439 Reaction to severe stress, unspecified: Secondary | ICD-10-CM | POA: Diagnosis not present

## 2019-05-03 NOTE — Progress Notes (Signed)
Patient ID: Kristy Vega, female   DOB: Nov 07, 1960, 59 y.o.   MRN: [id number]   Virtual Visit via video Note  This visit type was conducted due to national recommendations for restrictions regarding the COVID-19 pandemic (e.g. social distancing).  This format is felt to be most appropriate for this patient at this time.  All issues noted in this document were discussed and addressed.  No physical exam was performed (except for noted visual exam findings with Video Visits).   I connected with Lear Ng by a video enabled telemedicine application and verified that I am speaking with the correct person using two identifiers. Location patient: home Location provider: work Persons participating in the virtual visit: patient, provider  The limitations, risks, security and privacy concerns of performing an evaluation and management service by video and the availability of in person appointments have been discussed. The patient expressed understanding and agreed to proceed.   Reason for visit: scheduled follow up.   HPI: She reports she is doing relatively well.  Some increased stress - work, covid, Social research officer, government.  Overall handling things relatively well.  Has not been sick.  Denies any current congestion, cough or sob.  No nausea or vomiting reported.  No abdominal pain or bowel change reported.  Did get her first covid vaccine.  Had some redness upper left arm - initially.  Appears to be more bruised now.  Blood pressure initially a little elevated today, but recheck within a few minutes 120/80. Overall doing well.     ROS: See pertinent positives and negatives per HPI.  Past Medical History:  Diagnosis Date  . History of colon polyps   . Vaginal delivery    X 2    Past Surgical History:  Procedure Laterality Date  . ABDOMINAL HYSTERECTOMY     Dr Josefa Half, for menorrhagia  . CHOLECYSTECTOMY    . INCISION / DRAINAGE HAND / FINGER     removal of sewing needle    Family History  Problem  Relation Age of Onset  . Hypertension Mother   . Cancer Father        prostate  . Stroke Maternal Grandfather   . Breast cancer Neg Hx     SOCIAL HX: reviewed.    Current Outpatient Medications:  .  hydrochlorothiazide (MICROZIDE) 12.5 MG capsule, TAKE 1 CAPSULE BY MOUTH ONCE DAILY ALONG WITH LOSARTAN TABLET, Disp: 90 capsule, Rfl: 0 .  losartan (COZAAR) 100 MG tablet, TAKE 1 TABLET BY MOUTH DAILY WITH HCTZ, Disp: 90 tablet, Rfl: 2 .  metoprolol succinate (TOPROL-XL) 100 MG 24 hr tablet, TAKE 1 TABLET BY MOUTH ONCE DAILY. TAKE WITH OR IMMEDIATELY FOLLOWING A MEAL., Disp: 90 tablet, Rfl: 1 .  Multiple Vitamin (MULTIVITAMIN) tablet, Take 1 tablet by mouth daily., Disp: , Rfl:   EXAM:  VITALS per patient if applicable: 99991111 - recheck.   GENERAL: alert, oriented, appears well and in no acute distress  HEENT: atraumatic, conjunttiva clear, no obvious abnormalities on inspection of external nose and ears  NECK: normal movements of the head and neck  LUNGS: on inspection no signs of respiratory distress, breathing rate appears normal, no obvious gross SOB, gasping or wheezing  CV: no obvious cyanosis  PSYCH/NEURO: pleasant and cooperative, no obvious depression or anxiety, speech and thought processing grossly intact  ASSESSMENT AND PLAN:  Discussed the following assessment and plan:  Essential hypertension, benign Blood pressure as outlined.  Continue current medication regimen.  Follow pressures.  Follow metabolic panel.  Stress Handling stress.  Overall doing well.  Follow.     No orders of the defined types were placed in this encounter.   No orders of the defined types were placed in this encounter.    I discussed the assessment and treatment plan with the patient. The patient was provided an opportunity to ask questions and all were answered. The patient agreed with the plan and demonstrated an understanding of the instructions.   The patient was advised to call  back or seek an in-person evaluation if the symptoms worsen or if the condition fails to improve as anticipated.   Einar Pheasant, MD

## 2019-05-04 ENCOUNTER — Encounter: Payer: Self-pay | Admitting: Internal Medicine

## 2019-05-05 ENCOUNTER — Encounter: Payer: Self-pay | Admitting: Internal Medicine

## 2019-05-05 NOTE — Assessment & Plan Note (Signed)
Handling stress.  Overall doing well.  Follow.

## 2019-05-05 NOTE — Assessment & Plan Note (Signed)
Blood pressure as outlined.  Continue current medication regimen.  Follow pressures.  Follow metabolic panel.  

## 2019-05-14 ENCOUNTER — Other Ambulatory Visit: Payer: Self-pay | Admitting: Internal Medicine

## 2019-05-14 MED FILL — METOPROLOL SUCCINATE ER 100: 100 | 90 days supply | Qty: 90 | Fill #0

## 2019-06-08 DIAGNOSIS — C44319 Basal cell carcinoma of skin of other parts of face: Secondary | ICD-10-CM | POA: Diagnosis not present

## 2019-06-08 DIAGNOSIS — C4491 Basal cell carcinoma of skin, unspecified: Secondary | ICD-10-CM

## 2019-06-08 DIAGNOSIS — L738 Other specified follicular disorders: Secondary | ICD-10-CM | POA: Diagnosis not present

## 2019-06-08 DIAGNOSIS — L720 Epidermal cyst: Secondary | ICD-10-CM | POA: Diagnosis not present

## 2019-06-08 DIAGNOSIS — D2239 Melanocytic nevi of other parts of face: Secondary | ICD-10-CM | POA: Diagnosis not present

## 2019-06-08 HISTORY — DX: Basal cell carcinoma of skin, unspecified: C44.91

## 2019-06-23 MED FILL — LOSARTAN POTASSIUM 100 MG T: 100 | 90 days supply | Qty: 90 | Fill #4

## 2019-07-17 ENCOUNTER — Other Ambulatory Visit: Payer: Self-pay | Admitting: Internal Medicine

## 2019-07-17 MED FILL — HYDROCHLOROTHIAZIDE 12.5 MG: 12.5 | 90 days supply | Qty: 90 | Fill #0

## 2019-08-13 MED FILL — METOPROLOL SUCCINATE ER 100: 100 | 90 days supply | Qty: 90 | Fill #1

## 2019-08-16 DIAGNOSIS — H04123 Dry eye syndrome of bilateral lacrimal glands: Secondary | ICD-10-CM | POA: Diagnosis not present

## 2019-09-12 DIAGNOSIS — C44319 Basal cell carcinoma of skin of other parts of face: Secondary | ICD-10-CM | POA: Diagnosis not present

## 2019-09-12 DIAGNOSIS — Z85828 Personal history of other malignant neoplasm of skin: Secondary | ICD-10-CM | POA: Insufficient documentation

## 2019-09-13 ENCOUNTER — Ambulatory Visit (INDEPENDENT_AMBULATORY_CARE_PROVIDER_SITE_OTHER): Payer: 59 | Admitting: Internal Medicine

## 2019-09-13 ENCOUNTER — Other Ambulatory Visit (HOSPITAL_COMMUNITY)
Admission: RE | Admit: 2019-09-13 | Discharge: 2019-09-13 | Disposition: A | Discharge status: Home / Self Care | Payer: 59 | Source: Ambulatory Visit | Attending: Internal Medicine | Admitting: Internal Medicine

## 2019-09-13 ENCOUNTER — Other Ambulatory Visit: Payer: Self-pay

## 2019-09-13 ENCOUNTER — Encounter: Payer: Self-pay | Admitting: Internal Medicine

## 2019-09-13 VITALS — BP 112/78 | HR 79 | Temp 97.4°F | Resp 16 | Ht 62.0 in | Wt 179.0 lb

## 2019-09-13 DIAGNOSIS — I10 Essential (primary) hypertension: Secondary | ICD-10-CM | POA: Diagnosis not present

## 2019-09-13 DIAGNOSIS — Z124 Encounter for screening for malignant neoplasm of cervix: Secondary | ICD-10-CM

## 2019-09-13 DIAGNOSIS — Z86010 Personal history of colonic polyps: Secondary | ICD-10-CM | POA: Diagnosis not present

## 2019-09-13 DIAGNOSIS — F439 Reaction to severe stress, unspecified: Secondary | ICD-10-CM

## 2019-09-13 DIAGNOSIS — Z0000 Encounter for general adult medical examination without abnormal findings: Secondary | ICD-10-CM

## 2019-09-13 NOTE — Assessment & Plan Note (Addendum)
Physical today 09/13/19.  PAP 09/13/19.  Colonoscopy 2012.  Recommended f/u in 10 years.  Hemoccult cards given.  Mammogram 02/22/19 - Birads I.

## 2019-09-13 NOTE — Progress Notes (Signed)
Patient ID: Kristy Vega, female   DOB: 15-Aug-1960, 59 y.o.   MRN: [id number]   Subjective:    Patient ID: Kristy Vega, female    DOB: September 13, 1960, 59 y.o.   MRN: [id number]  HPI This visit occurred during the SARS-CoV-2 public health emergency.  Safety protocols were in place, including screening questions prior to the visit, additional usage of staff PPE, and extensive cleaning of exam room while observing appropriate contact time as indicated for disinfecting solutions.  Patient here for her physical exam. She reports she is doing relatively well.  Is s/p Mohs surgery yesterday. Did well.  Back at work.  Tries to stay active.  No chest pain or sob reported.  No abdominal pain or bowel change reported.  Handling stress.  Some knee issues. Notices more - up and down steps.  Will notify me if feel needs further intervention.  She is juicing.     Past Medical History:  Diagnosis Date  . History of colon polyps   . Vaginal delivery    X 2   Past Surgical History:  Procedure Laterality Date  . ABDOMINAL HYSTERECTOMY     Dr Josefa Half, for menorrhagia  . CHOLECYSTECTOMY    . INCISION / DRAINAGE HAND / FINGER     removal of sewing needle   Family History  Problem Relation Age of Onset  . Hypertension Mother   . Cancer Father        prostate  . Stroke Maternal Grandfather   . Breast cancer Neg Hx    Social History   Socioeconomic History  . Marital status: Married    Spouse name: Not on file  . Number of children: Not on file  . Years of education: Not on file  . Highest education level: Not on file  Occupational History  . Not on file  Tobacco Use  . Smoking status: Never Smoker  . Smokeless tobacco: Never Used  Substance and Sexual Activity  . Alcohol use: Yes    Alcohol/week: 0.0 standard drinks    Comment: occasional  . Drug use: Not on file  . Sexual activity: Not on file  Other Topics Concern  . Not on file  Social History Narrative   Exercises regularly      Animals: Dogs and horses   Social Determinants of Health   Financial Resource Strain:   . Difficulty of Paying Living Expenses:   Food Insecurity:   . Worried About Charity fundraiser in the Last Year:   . Arboriculturist in the Last Year:   Transportation Needs:   . Film/video editor (Medical):   Marland Kitchen Lack of Transportation (Non-Medical):   Physical Activity:   . Days of Exercise per Week:   . Minutes of Exercise per Session:   Stress:   . Feeling of Stress :   Social Connections:   . Frequency of Communication with Friends and Family:   . Frequency of Social Gatherings with Friends and Family:   . Attends Religious Services:   . Active Member of Clubs or Organizations:   . Attends Archivist Meetings:   Marland Kitchen Marital Status:     Outpatient Encounter Medications as of 09/13/2019  Medication Sig  . hydrochlorothiazide (MICROZIDE) 12.5 MG capsule TAKE 1 CAPSULE BY MOUTH ONCE DAILY ALONG WITH LOSARTAN TABLET  . losartan (COZAAR) 100 MG tablet TAKE 1 TABLET BY MOUTH DAILY WITH HCTZ  . metoprolol succinate (TOPROL-XL) 100 MG 24 hr tablet  TAKE 1 TABLET BY MOUTH ONCE DAILY. TAKE WITH OR IMMEDIATELY FOLLOWING A MEAL.  . Multiple Vitamin (MULTIVITAMIN) tablet Take 1 tablet by mouth daily.   No facility-administered encounter medications on file as of 09/13/2019.    Review of Systems  Constitutional: Negative for appetite change and unexpected weight change.  HENT: Negative for congestion and sinus pressure.   Eyes: Negative for pain and visual disturbance.  Respiratory: Negative for cough, chest tightness and shortness of breath.   Cardiovascular: Negative for chest pain, palpitations and leg swelling.  Gastrointestinal: Negative for abdominal pain, diarrhea, nausea and vomiting.  Genitourinary: Negative for difficulty urinating and dysuria.  Musculoskeletal: Negative for joint swelling and myalgias.       Some knee issues as outlined.   Skin: Negative for color change and  rash.  Neurological: Negative for dizziness, light-headedness and headaches.  Hematological: Negative for adenopathy. Does not bruise/bleed easily.  Psychiatric/Behavioral: Negative for agitation and dysphoric mood.       Objective:    Physical Exam Vitals reviewed.  Constitutional:      General: She is not in acute distress.    Appearance: Normal appearance. She is well-developed.  HENT:     Head: Normocephalic and atraumatic.     Right Ear: External ear normal.     Left Ear: External ear normal.  Eyes:     General: No scleral icterus.       Right eye: No discharge.        Left eye: No discharge.     Conjunctiva/sclera: Conjunctivae normal.  Neck:     Thyroid: No thyromegaly.  Cardiovascular:     Rate and Rhythm: Normal rate and regular rhythm.  Pulmonary:     Effort: No tachypnea, accessory muscle usage or respiratory distress.     Breath sounds: Normal breath sounds. No decreased breath sounds or wheezing.  Chest:     Breasts:        Right: No inverted nipple, mass, nipple discharge or tenderness (no axillary adenopathy).        Left: No inverted nipple, mass, nipple discharge or tenderness (no axilarry adenopathy).  Abdominal:     General: Bowel sounds are normal.     Palpations: Abdomen is soft.     Tenderness: There is no abdominal tenderness.  Genitourinary:    Comments: Normal external genitalia.  Vaginal vault without lesions.  S/p hysterectomy.  Vaginal prolapse.  Pap smear performed.  Could not appreciate any adnexal masses or tenderness.   Musculoskeletal:        General: No swelling or tenderness.     Cervical back: Neck supple. No tenderness.  Lymphadenopathy:     Cervical: No cervical adenopathy.  Skin:    Findings: No erythema or rash.  Neurological:     Mental Status: She is alert and oriented to person, place, and time.  Psychiatric:        Mood and Affect: Mood normal.        Behavior: Behavior normal.     BP 112/78   Pulse 79   Temp (!)  97.4 F (36.3 C)   Resp 16   Ht 5\' 2"  (1.575 m)   Wt 179 lb (81.2 kg)   SpO2 97%   BMI 32.74 kg/m  Wt Readings from Last 3 Encounters:  09/13/19 179 lb (81.2 kg)  05/03/19 179 lb (81.2 kg)  12/28/18 179 lb 9.6 oz (81.5 kg)     Lab Results  Component Value Date   WBC  5.5 08/21/2018   HGB 14.6 08/21/2018   HCT 42.7 08/21/2018   PLT 276.0 08/21/2018   GLUCOSE 100 (H) 12/26/2018   CHOL 186 12/26/2018   TRIG 128.0 12/26/2018   HDL 58.80 12/26/2018   LDLDIRECT 122.8 07/24/2012   LDLCALC 102 (H) 12/26/2018   ALT 16 12/26/2018   AST 17 12/26/2018   NA 138 12/26/2018   K 3.9 12/26/2018   CL 103 12/26/2018   CREATININE 0.62 12/26/2018   BUN 16 12/26/2018   CO2 28 12/26/2018   TSH 2.99 08/21/2018   INR 1.0 11/22/2016   HGBA1C 5.5 02/16/2016   MICROALBUR <0.7 09/02/2014    MM 3D SCREEN BREAST BILATERAL  Result Date: 02/22/2019 CLINICAL DATA:  Screening. EXAM: DIGITAL SCREENING BILATERAL MAMMOGRAM WITH TOMO AND CAD COMPARISON:  Previous exam(s). ACR Breast Density Category b: There are scattered areas of fibroglandular density. FINDINGS: There are no findings suspicious for malignancy. Images were processed with CAD. IMPRESSION: No mammographic evidence of malignancy. A result letter of this screening mammogram will be mailed directly to the patient. RECOMMENDATION: Screening mammogram in one year. (Code:SM-B-01Y) BI-RADS CATEGORY  1: Negative. Electronically Signed   By: Lillia Mountain M.D.   On: 02/22/2019 14:29       Assessment & Plan:   Problem List Items Addressed This Visit    Essential hypertension, benign (Chronic)    Blood pressure has been doing well.  Continue losartan, hctz and metoprolol.  Follow pressures.  Follow metabolic panel.       Relevant Orders   Comprehensive metabolic panel   CBC with Differential/Platelet   TSH   Lipid panel   Health care maintenance    Physical today 09/13/19.  PAP 09/13/19.  Colonoscopy 2012.  Recommended f/u in 10 years.  Hemoccult  cards given.  Mammogram 02/22/19 - Bir[facility] I.       History of colonic polyps    Colonoscopy 2012 - hyperplastic polyps.  Recommended f/u in 10 years.  Hemoccult cards given.        Stress    Handling stress.  Doing well.         Other Visit Diagnoses    Cervical cancer screening    -  Primary   Relevant Orders   Cytology - PAP( [place])       Einar Pheasant, MD

## 2019-09-17 ENCOUNTER — Encounter: Payer: Self-pay | Admitting: Internal Medicine

## 2019-09-17 LAB — CYTOLOGY - PAP
Comment: NEGATIVE
Diagnosis: NEGATIVE
High risk HPV: NEGATIVE

## 2019-09-17 NOTE — Assessment & Plan Note (Signed)
Handling stress.  Doing well.

## 2019-09-17 NOTE — Assessment & Plan Note (Signed)
Blood pressure has been doing well.  Continue losartan, hctz and metoprolol.  Follow pressures.  Follow metabolic panel.

## 2019-09-17 NOTE — Assessment & Plan Note (Signed)
Colonoscopy 2012 - hyperplastic polyps.  Recommended f/u in 10 years.  Hemoccult cards given.

## 2019-09-18 ENCOUNTER — Encounter: Payer: Self-pay | Admitting: Internal Medicine

## 2019-09-21 ENCOUNTER — Other Ambulatory Visit: Payer: Self-pay | Admitting: Internal Medicine

## 2019-10-10 ENCOUNTER — Other Ambulatory Visit: Payer: Self-pay

## 2019-10-10 DIAGNOSIS — R21 Rash and other nonspecific skin eruption: Secondary | ICD-10-CM

## 2019-10-10 MED ORDER — TERBINAFINE HCL 250 MG PO TABS: 250.0000 mg | ORAL_TABLET | Freq: Every day | ORAL | 2 refills | Status: DC

## 2019-10-10 MED FILL — TERBINAFINE HCL 250 MG TAB: 250 | 30 days supply | Qty: 30 | Fill #0

## 2019-10-22 ENCOUNTER — Other Ambulatory Visit: Payer: Self-pay | Admitting: Internal Medicine

## 2019-10-22 MED FILL — HYDROCHLOROTHIAZIDE 12.5 MG: 12.5 | 90 days supply | Qty: 90 | Fill #0

## 2019-11-12 MED FILL — TERBINAFINE HCL 250 MG TAB: 250 | 30 days supply | Qty: 30 | Fill #1

## 2019-11-12 MED FILL — METOPROLOL SUCCINATE ER 100: 100 | 90 days supply | Qty: 90 | Fill #0

## 2019-12-07 MED FILL — TERBINAFINE HCL 250 MG TAB: 250 | 30 days supply | Qty: 30 | Fill #2

## 2019-12-07 MED FILL — LOSARTAN POTASSIUM 100 MG T: 100 | 90 days supply | Qty: 90 | Fill #1

## 2020-01-09 ENCOUNTER — Encounter: Payer: Self-pay | Admitting: Internal Medicine

## 2020-01-14 ENCOUNTER — Other Ambulatory Visit: Payer: Self-pay | Admitting: Internal Medicine

## 2020-01-14 MED FILL — HYDROCHLOROTHIAZIDE 12.5 MG: 12.5 | 90 days supply | Qty: 90 | Fill #0

## 2020-01-23 IMAGING — MG MM DIGITAL SCREENING BILAT W/ TOMO W/ CAD
8 series · 8 of 24 positions shown · non-contrast
Comparison: Previous exam(s).

CLINICAL DATA: Screening.

EXAM:
DIGITAL SCREENING BILATERAL MAMMOGRAM WITH TOMO AND CAD

[R MLO synth-2D]
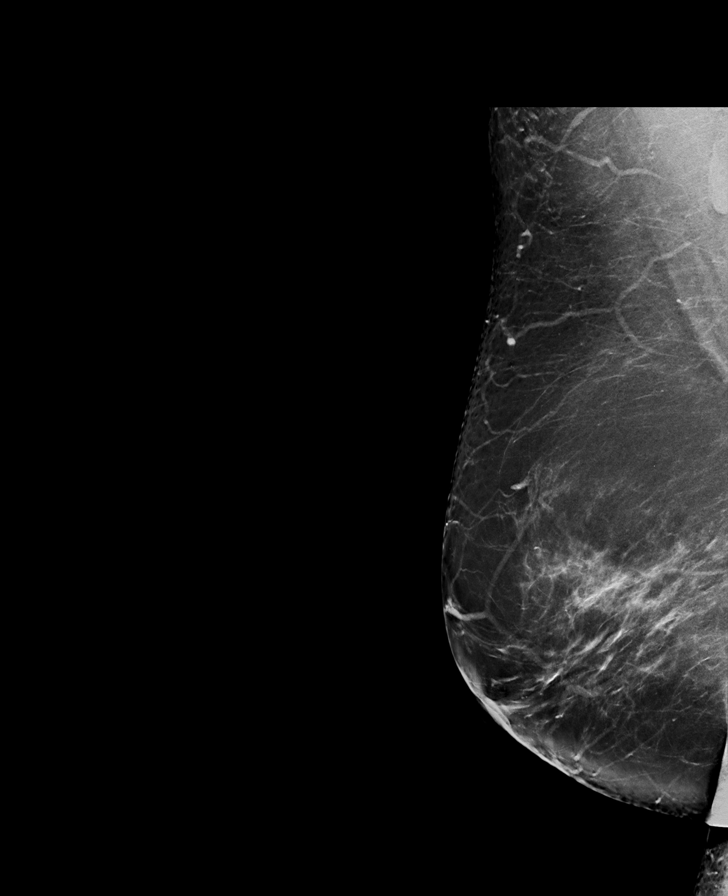

[L MLO synth-2D]
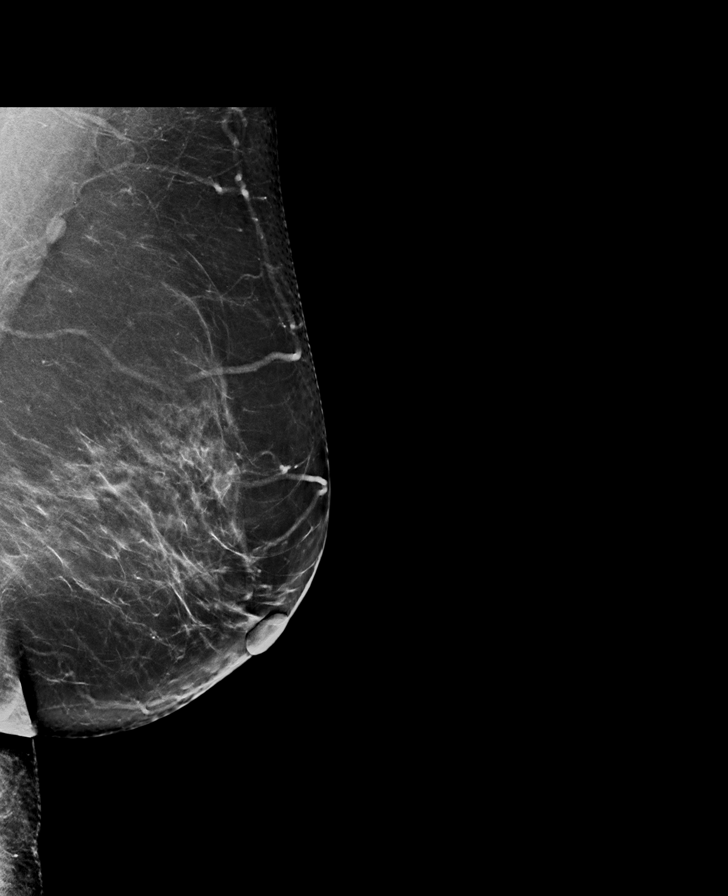

[R CC synth-2D]
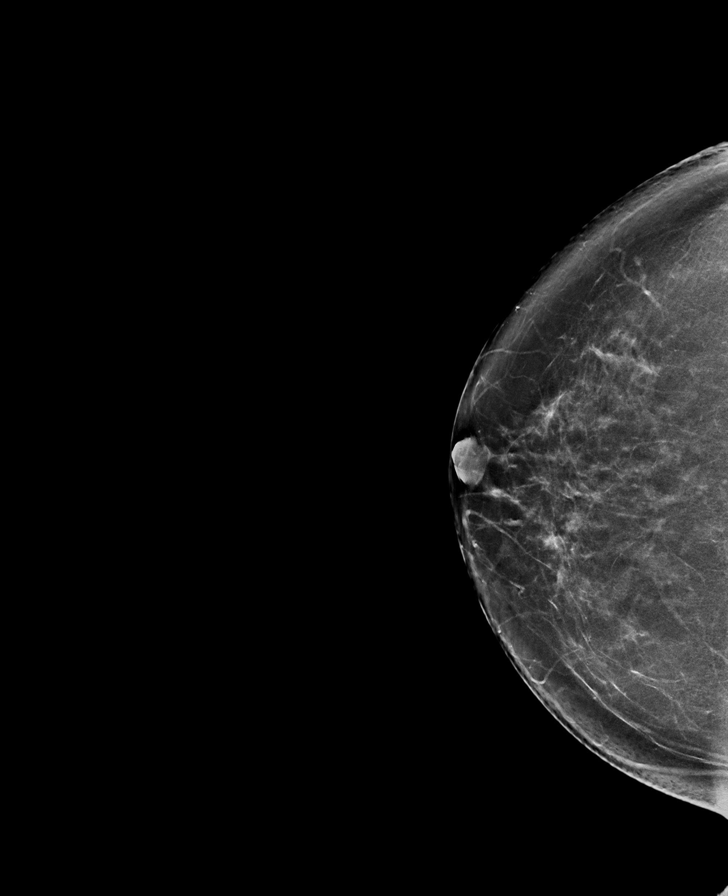

[L CC synth-2D]
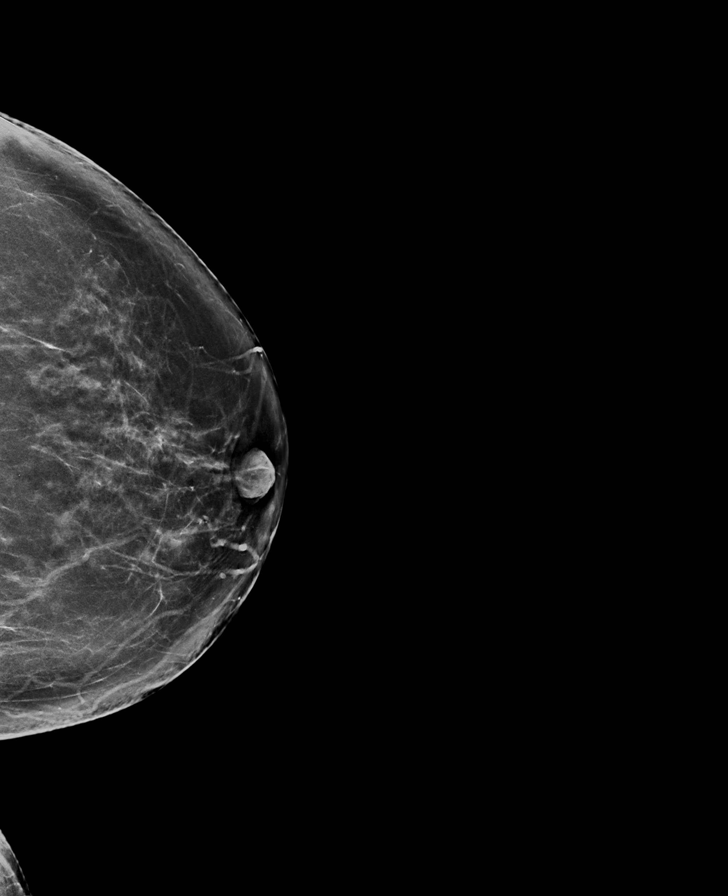

[R MLO tomo · tomo slice 43/86.0]
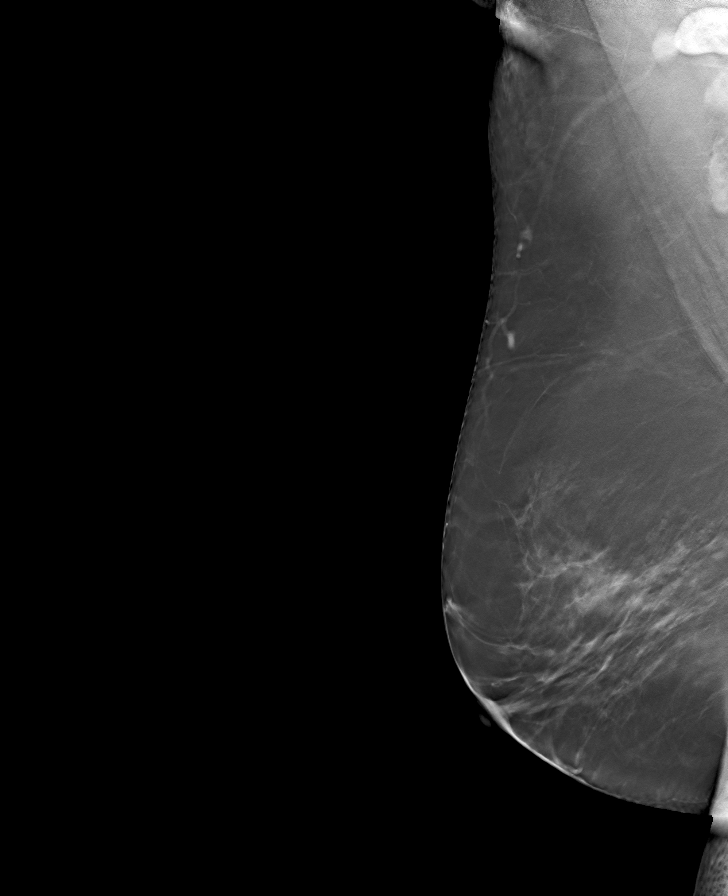

[L CC tomo · tomo slice 40/79.0]
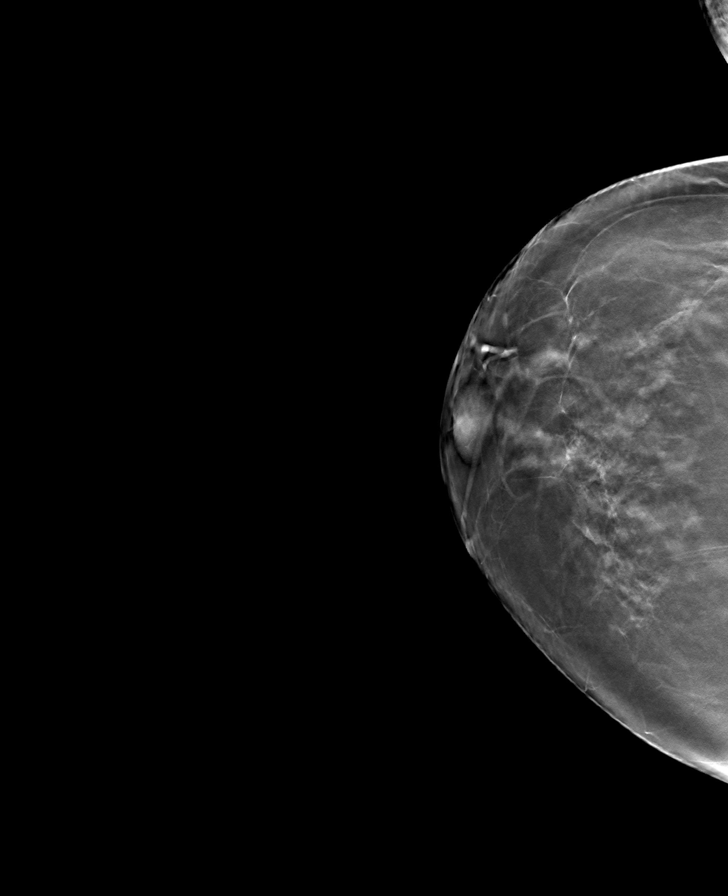

[L MLO tomo · tomo slice 43/86.0]
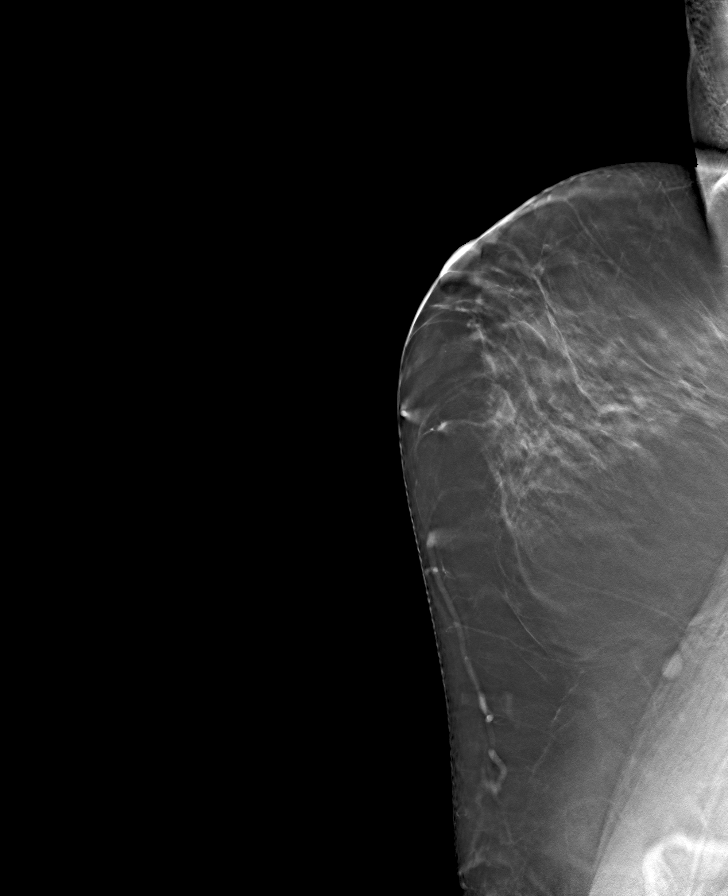

[R CC tomo · tomo slice 39/77.0]
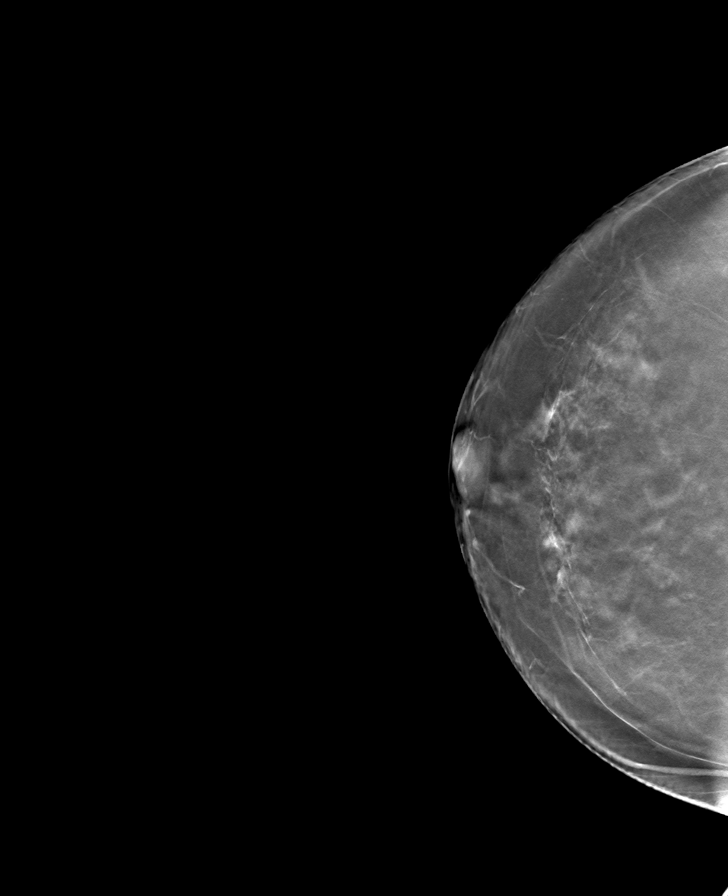

[8 of 24 positions shown; findings below may reference images not displayed]

ACR Breast Density Category c: The breast tissue is heterogeneously
dense, which may obscure small masses.
FINDINGS: There are no findings suspicious for malignancy. Images were
processed with CAD.
IMPRESSION: No mammographic evidence of malignancy. A result letter of this
screening mammogram will be mailed directly to the patient.

RECOMMENDATION:
Screening mammogram in one year. (Code:FT-U-LHB)

BI-RADS CATEGORY  1: Negative.

## 2020-01-29 ENCOUNTER — Other Ambulatory Visit: Payer: Self-pay

## 2020-01-29 ENCOUNTER — Other Ambulatory Visit (INDEPENDENT_AMBULATORY_CARE_PROVIDER_SITE_OTHER): Payer: 59

## 2020-01-29 DIAGNOSIS — I10 Essential (primary) hypertension: Secondary | ICD-10-CM | POA: Diagnosis not present

## 2020-01-29 LAB — CBC WITH DIFFERENTIAL/PLATELET
Basophils Absolute: 0 10*3/uL (ref 0.0–0.1)
Basophils Relative: 0.6 % (ref 0.0–3.0)
Eosinophils Absolute: 0.2 10*3/uL (ref 0.0–0.7)
Eosinophils Relative: 3.5 % (ref 0.0–5.0)
HCT: 40.2 % (ref 36.0–46.0)
Hemoglobin: 13.9 g/dL (ref 12.0–15.0)
Lymphocytes Relative: 37.1 % (ref 12.0–46.0)
Lymphs Abs: 1.9 10*3/uL (ref 0.7–4.0)
MCHC: 34.5 g/dL (ref 30.0–36.0)
MCV: 96.1 fl (ref 78.0–100.0)
Monocytes Absolute: 0.4 10*3/uL (ref 0.1–1.0)
Monocytes Relative: 7.7 % (ref 3.0–12.0)
Neutro Abs: 2.6 10*3/uL (ref 1.4–7.7)
Neutrophils Relative %: 51.1 % (ref 43.0–77.0)
Platelets: 274 10*3/uL (ref 150.0–400.0)
RBC: 4.19 Mil/uL (ref 3.87–5.11)
RDW: 12.6 % (ref 11.5–15.5)
WBC: 5.2 10*3/uL (ref 4.0–10.5)

## 2020-01-29 LAB — COMPREHENSIVE METABOLIC PANEL
ALT: 12 U/L (ref 0–35)
AST: 15 U/L (ref 0–37)
Albumin: 4 g/dL (ref 3.5–5.2)
Alkaline Phosphatase: 80 U/L (ref 39–117)
BUN: 12 mg/dL (ref 6–23)
CO2: 31 mEq/L (ref 19–32)
Calcium: 9 mg/dL (ref 8.4–10.5)
Chloride: 99 mEq/L (ref 96–112)
Creatinine, Ser: 0.67 mg/dL (ref 0.40–1.20)
GFR: 94.96 mL/min (ref >60.00)
Glucose, Bld: 92 mg/dL (ref 70–99)
Potassium: 3.8 mEq/L (ref 3.5–5.1)
Sodium: 137 mEq/L (ref 135–145)
Total Bilirubin: 0.5 mg/dL (ref 0.2–1.2)
Total Protein: 7.2 g/dL (ref 6.0–8.3)

## 2020-01-29 LAB — LIPID PANEL
Cholesterol: 191 mg/dL (ref 0–200)
HDL: 62.1 mg/dL (ref >39.00)
LDL Cholesterol: 100 mg/dL — ABNORMAL HIGH (ref 0–99)
NonHDL: 128.62
Total CHOL/HDL Ratio: 3
Triglycerides: 142 mg/dL (ref 0.0–149.0)
VLDL: 28.4 mg/dL (ref 0.0–40.0)

## 2020-01-29 LAB — TSH: TSH: 3.03 u[IU]/mL (ref 0.35–4.50)

## 2020-01-31 ENCOUNTER — Telehealth (INDEPENDENT_AMBULATORY_CARE_PROVIDER_SITE_OTHER): Payer: 59 | Admitting: Internal Medicine

## 2020-01-31 ENCOUNTER — Other Ambulatory Visit: Payer: Self-pay

## 2020-01-31 ENCOUNTER — Encounter: Payer: Self-pay | Admitting: Internal Medicine

## 2020-01-31 VITALS — Ht 62.0 in | Wt 179.0 lb

## 2020-01-31 DIAGNOSIS — F439 Reaction to severe stress, unspecified: Secondary | ICD-10-CM | POA: Diagnosis not present

## 2020-01-31 DIAGNOSIS — I10 Essential (primary) hypertension: Secondary | ICD-10-CM | POA: Diagnosis not present

## 2020-01-31 DIAGNOSIS — Z1231 Encounter for screening mammogram for malignant neoplasm of breast: Secondary | ICD-10-CM | POA: Diagnosis not present

## 2020-01-31 NOTE — Progress Notes (Signed)
Patient ID: Kristy Vega, female   DOB: [birth year], 59 y.o.   MRN: [id number]   Virtual Visit via video Note  This visit type was conducted due to national recommendations for restrictions regarding the COVID-19 pandemic (e.g. social distancing).  This format is felt to be most appropriate for this patient at this time.  All issues noted in this document were discussed and addressed.  No physical exam was performed (except for noted visual exam findings with Video Visits).   I connected with Lear Ng by a video enabled telemedicine application and verified that I am speaking with the correct person using two identifiers. Location patient: home Location provider: work  Persons participating in the virtual visit: patient, provider  The limitations, risks, security and privacy concerns of performing an evaluation and management service by video and the availability of in person appointments have been discussed.  It has also been discussed with the patient that there may be a patient responsible charge related to this service. The patient expressed understanding and agreed to proceed.   Reason for visit: schedule follow up appt  HPI: Follow up regarding her blood pressure.  Increased stress.  Brother recently diagnosed with tongue cancer.  Discussed with her.  Overall she feels she is handling things relatively well.  Tries to stay active.  No chest pain or sob.  No acid reflux reported.  No abdominal pain or bowel change reported.  Blood pressures ok.     ROS: See pertinent positives and negatives per HPI.  Past Medical History:  Diagnosis Date  . History of colon polyps   . Vaginal delivery    X 2    Past Surgical History:  Procedure Laterality Date  . ABDOMINAL HYSTERECTOMY     Dr Josefa Half, for menorrhagia  . CHOLECYSTECTOMY    . INCISION / DRAINAGE HAND / FINGER     removal of sewing needle    Family History  Problem Relation Age of Onset  . Hypertension Mother   . Cancer  Father        prostate  . Stroke Maternal Grandfather   . Breast cancer Neg Hx     SOCIAL HX: reviewed.    Current Outpatient Medications:  .  Ascorbic Acid (VITAMIN C PO), Take by mouth., Disp: , Rfl:  .  Cholecalciferol (VITAMIN D3 PO), Take by mouth., Disp: , Rfl:  .  hydrochlorothiazide (MICROZIDE) 12.5 MG capsule, TAKE 1 CAPSULE BY MOUTH ONCE DAILY ALONG WITH LOSARTAN TABLET, Disp: 90 capsule, Rfl: 0 .  losartan (COZAAR) 100 MG tablet, TAKE 1 TABLET BY MOUTH DAILY WITH HCTZ, Disp: 90 tablet, Rfl: 2 .  metoprolol succinate (TOPROL-XL) 100 MG 24 hr tablet, TAKE 1 TABLET BY MOUTH ONCE A DAY WITH OR IMMEDIATELY FOLLOWING A MEAL, Disp: 90 tablet, Rfl: 1 .  Multiple Vitamin (MULTIVITAMIN) tablet, Take 1 tablet by mouth daily., Disp: , Rfl:  .  Zinc 30 MG CAPS, Take by mouth., Disp: , Rfl:   EXAM:  GENERAL: alert, oriented, appears well and in no acute distress  HEENT: atraumatic, conjunttiva clear, no obvious abnormalities on inspection of external nose and ears  NECK: normal movements of the head and neck  LUNGS: on inspection no signs of respiratory distress, breathing rate appears normal, no obvious gross SOB, gasping or wheezing  CV: no obvious cyanosis  PSYCH/NEURO: pleasant and cooperative, no obvious depression or anxiety, speech and thought processing grossly intact  ASSESSMENT AND PLAN:  Discussed the following assessment and plan:  Problem List Items Addressed This Visit    Stress    Increased stress as outlined.  Discussed with her.  Overall she feels she is handling things relatively well.  Follow.        Essential hypertension, benign (Chronic)    Blood pressure has been doing well on losartan, hctz and metoprolol.  Follow pressures.  Follow metabolic panel.        Other Visit Diagnoses    Encounter for screening mammogram for malignant neoplasm of breast    -  Primary   Relevant Orders   MM 3D SCREEN BREAST BILATERAL       I discussed the assessment  and treatment plan with the patient. The patient was provided an opportunity to ask questions and all were answered. The patient agreed with the plan and demonstrated an understanding of the instructions.   The patient was advised to call back or seek an in-person evaluation if the symptoms worsen or if the condition fails to improve as anticipated.   Einar Pheasant, MD

## 2020-02-02 ENCOUNTER — Encounter: Payer: Self-pay | Admitting: Internal Medicine

## 2020-02-02 NOTE — Assessment & Plan Note (Signed)
Increased stress as outlined.  Discussed with her.  Overall she feels she is handling things relatively well.  Follow.

## 2020-02-02 NOTE — Assessment & Plan Note (Signed)
Blood pressure has been doing well on losartan, hctz and metoprolol.  Follow pressures.  Follow metabolic panel.  °

## 2020-02-04 ENCOUNTER — Encounter: Payer: Self-pay | Admitting: Internal Medicine

## 2020-02-08 MED FILL — METOPROLOL SUCCINATE ER 100: 100 | 90 days supply | Qty: 90 | Fill #1

## 2020-02-18 ENCOUNTER — Encounter: Payer: Self-pay | Admitting: Internal Medicine

## 2020-02-19 DIAGNOSIS — C44319 Basal cell carcinoma of skin of other parts of face: Secondary | ICD-10-CM | POA: Diagnosis not present

## 2020-02-26 ENCOUNTER — Ambulatory Visit
Admission: RE | Admit: 2020-02-26 | Discharge: 2020-02-26 | Disposition: A | Discharge status: Home / Self Care | Payer: 59 | Source: Ambulatory Visit | Attending: Internal Medicine | Admitting: Internal Medicine

## 2020-02-26 ENCOUNTER — Other Ambulatory Visit: Payer: Self-pay

## 2020-02-26 DIAGNOSIS — Z1231 Encounter for screening mammogram for malignant neoplasm of breast: Secondary | ICD-10-CM | POA: Insufficient documentation

## 2020-03-01 ENCOUNTER — Encounter: Payer: Self-pay | Admitting: Internal Medicine

## 2020-03-01 ENCOUNTER — Other Ambulatory Visit: Payer: Self-pay | Admitting: Internal Medicine

## 2020-03-01 DIAGNOSIS — R928 Other abnormal and inconclusive findings on diagnostic imaging of breast: Secondary | ICD-10-CM

## 2020-03-01 NOTE — Progress Notes (Signed)
Order placed for f/u left breast mammogram and ultrasound.   

## 2020-03-03 NOTE — Telephone Encounter (Signed)
See result note.  

## 2020-03-05 DIAGNOSIS — D3131 Benign neoplasm of right choroid: Secondary | ICD-10-CM | POA: Diagnosis not present

## 2020-03-10 ENCOUNTER — Ambulatory Visit
Admission: RE | Admit: 2020-03-10 | Discharge: 2020-03-10 | Disposition: A | Discharge status: Home / Self Care | Payer: 59 | Source: Ambulatory Visit | Attending: Internal Medicine | Admitting: Internal Medicine

## 2020-03-10 ENCOUNTER — Other Ambulatory Visit: Payer: Self-pay

## 2020-03-10 DIAGNOSIS — R928 Other abnormal and inconclusive findings on diagnostic imaging of breast: Secondary | ICD-10-CM | POA: Insufficient documentation

## 2020-03-10 DIAGNOSIS — R922 Inconclusive mammogram: Secondary | ICD-10-CM | POA: Diagnosis not present

## 2020-03-10 DIAGNOSIS — N6012 Diffuse cystic mastopathy of left breast: Secondary | ICD-10-CM | POA: Diagnosis not present

## 2020-03-14 MED FILL — LOSARTAN POTASSIUM 100 MG T: 100 | 90 days supply | Qty: 90 | Fill #2

## 2020-04-11 ENCOUNTER — Other Ambulatory Visit: Payer: Self-pay | Admitting: Internal Medicine

## 2020-04-14 ENCOUNTER — Other Ambulatory Visit: Payer: Self-pay | Admitting: Internal Medicine

## 2020-04-14 MED FILL — HYDROCHLOROTHIAZIDE 12.5 MG: 12.5 | 90 days supply | Qty: 90 | Fill #0

## 2020-05-08 ENCOUNTER — Other Ambulatory Visit: Payer: Self-pay | Admitting: Dermatology

## 2020-05-08 MED ORDER — HYDROCOD POLST-CPM POLST ER 10-8 MG/5ML PO SUER: 5.0000 mL | Freq: Two times a day (BID) | ORAL | 0 refills | Status: DC | PRN

## 2020-05-09 ENCOUNTER — Other Ambulatory Visit: Payer: Self-pay | Admitting: Internal Medicine

## 2020-05-09 MED FILL — METOPROLOL SUCCINATE ER 100: 100 | 90 days supply | Qty: 90 | Fill #0

## 2020-06-13 ENCOUNTER — Other Ambulatory Visit: Payer: Self-pay | Admitting: Internal Medicine

## 2020-06-13 MED FILL — LOSARTAN POTASSIUM 100 MG T: 100 | 30 days supply | Qty: 30 | Fill #0

## 2020-07-04 ENCOUNTER — Other Ambulatory Visit (HOSPITAL_BASED_OUTPATIENT_CLINIC_OR_DEPARTMENT_OTHER): Payer: Self-pay

## 2020-07-17 ENCOUNTER — Other Ambulatory Visit (HOSPITAL_COMMUNITY): Payer: Self-pay

## 2020-07-17 ENCOUNTER — Other Ambulatory Visit: Payer: Self-pay | Admitting: Internal Medicine

## 2020-07-17 MED ORDER — HYDROCHLOROTHIAZIDE 12.5 MG PO CAPS
ORAL_CAPSULE | ORAL | 0 refills | Status: DC
Start: 2020-07-17 — End: 2020-10-15
  Filled 2020-07-17: qty 90, 90d supply, fill #0

## 2020-07-17 MED FILL — Losartan Potassium Tab 100 MG: ORAL | 90 days supply | Qty: 90 | Fill #0 | Status: AC

## 2020-07-29 ENCOUNTER — Other Ambulatory Visit: Payer: Self-pay

## 2020-07-29 MED ORDER — CLINDAMYCIN PHOSPHATE 1 % EX LOTN
TOPICAL_LOTION | CUTANEOUS | 1 refills | Status: DC
  Filled 2020-07-29: qty 60, 20d supply, fill #0

## 2020-07-29 MED ORDER — PIMECROLIMUS 1 % EX CREA
TOPICAL_CREAM | CUTANEOUS | 1 refills | Status: DC
  Filled 2020-07-29: qty 30, 20d supply, fill #0

## 2020-07-29 NOTE — Telephone Encounter (Signed)
Pt requesting refills of perioral dermatitis medications, Clindamycin lotion and Elidel cream   Ok refills erx'd to [facility] outpatient pharmacy Clindamycin lotion 60 ml 1 RF and Elidel cream 30 gm 1 RF

## 2020-07-30 ENCOUNTER — Other Ambulatory Visit (HOSPITAL_COMMUNITY): Payer: Self-pay

## 2020-07-30 ENCOUNTER — Other Ambulatory Visit: Payer: Self-pay

## 2020-07-30 MED FILL — Metoprolol Succinate Tab ER 24HR 100 MG (Tartrate Equiv): ORAL | 90 days supply | Qty: 90 | Fill #0 | Status: AC

## 2020-08-04 ENCOUNTER — Other Ambulatory Visit (HOSPITAL_COMMUNITY): Payer: Self-pay

## 2020-08-06 ENCOUNTER — Other Ambulatory Visit (HOSPITAL_COMMUNITY): Payer: Self-pay

## 2020-10-15 ENCOUNTER — Other Ambulatory Visit (HOSPITAL_COMMUNITY): Payer: Self-pay

## 2020-10-15 ENCOUNTER — Other Ambulatory Visit: Payer: Self-pay | Admitting: Internal Medicine

## 2020-10-15 MED ORDER — METOPROLOL SUCCINATE ER 100 MG PO TB24
ORAL_TABLET | ORAL | 1 refills | Status: DC
Start: 2020-10-15 — End: 2020-10-15
  Filled 2020-10-15: qty 90, fill #0

## 2020-10-15 MED ORDER — HYDROCHLOROTHIAZIDE 12.5 MG PO CAPS
ORAL_CAPSULE | ORAL | 0 refills | Status: DC
  Filled 2020-10-15: qty 90, fill #0

## 2020-10-15 MED FILL — Losartan Potassium Tab 100 MG: ORAL | 90 days supply | Qty: 90 | Fill #1 | Status: AC

## 2020-10-16 ENCOUNTER — Other Ambulatory Visit (HOSPITAL_COMMUNITY): Payer: Self-pay

## 2020-10-16 MED ORDER — METOPROLOL SUCCINATE ER 100 MG PO TB24
ORAL_TABLET | ORAL | 1 refills | Status: DC
Start: 2020-10-16 — End: 2021-05-01
  Filled 2020-10-16 – 2020-11-01 (×2): qty 90, 90d supply, fill #0
  Filled 2021-02-02: qty 90, 90d supply, fill #1

## 2020-10-16 MED ORDER — HYDROCHLOROTHIAZIDE 12.5 MG PO CAPS
ORAL_CAPSULE | ORAL | 1 refills | Status: DC
Start: 2020-10-16 — End: 2021-04-10
  Filled 2020-10-16: qty 90, 90d supply, fill #0
  Filled 2021-01-09: qty 90, 90d supply, fill #1

## 2020-11-01 ENCOUNTER — Other Ambulatory Visit (HOSPITAL_COMMUNITY): Payer: Self-pay

## 2020-11-03 ENCOUNTER — Other Ambulatory Visit (HOSPITAL_COMMUNITY): Payer: Self-pay

## 2020-11-13 ENCOUNTER — Other Ambulatory Visit: Payer: Self-pay

## 2020-11-13 DIAGNOSIS — Z1211 Encounter for screening for malignant neoplasm of colon: Secondary | ICD-10-CM | POA: Diagnosis not present

## 2020-11-13 MED ORDER — NA SULFATE-K SULFATE-MG SULF 17.5-3.13-1.6 GM/177ML PO SOLN
ORAL | 0 refills | Status: DC
Start: 2020-11-13 — End: 2021-03-23
  Filled 2020-11-13: qty 354, 1d supply, fill #0

## 2021-01-09 ENCOUNTER — Other Ambulatory Visit (HOSPITAL_COMMUNITY): Payer: Self-pay

## 2021-01-09 ENCOUNTER — Other Ambulatory Visit: Payer: Self-pay | Admitting: Internal Medicine

## 2021-01-09 MED ORDER — LOSARTAN POTASSIUM 100 MG PO TABS
ORAL_TABLET | ORAL | 2 refills | Status: DC
  Filled 2021-01-09: qty 90, 90d supply, fill #0
  Filled 2021-04-10: qty 90, 90d supply, fill #1
  Filled 2021-07-10: qty 90, 90d supply, fill #2

## 2021-01-09 MED FILL — Losartan Potassium Tab 100 MG: ORAL | 90 days supply | Qty: 90 | Fill #2 | Status: CN

## 2021-01-15 DIAGNOSIS — K621 Rectal polyp: Secondary | ICD-10-CM | POA: Diagnosis not present

## 2021-01-15 DIAGNOSIS — D124 Benign neoplasm of descending colon: Secondary | ICD-10-CM | POA: Diagnosis not present

## 2021-01-15 DIAGNOSIS — K5730 Diverticulosis of large intestine without perforation or abscess without bleeding: Secondary | ICD-10-CM | POA: Diagnosis not present

## 2021-01-15 DIAGNOSIS — K641 Second degree hemorrhoids: Secondary | ICD-10-CM | POA: Diagnosis not present

## 2021-01-15 DIAGNOSIS — D127 Benign neoplasm of rectosigmoid junction: Secondary | ICD-10-CM | POA: Diagnosis not present

## 2021-01-15 DIAGNOSIS — Z86010 Personal history of colonic polyps: Secondary | ICD-10-CM | POA: Diagnosis not present

## 2021-01-15 DIAGNOSIS — K640 First degree hemorrhoids: Secondary | ICD-10-CM | POA: Diagnosis not present

## 2021-01-15 DIAGNOSIS — Z1211 Encounter for screening for malignant neoplasm of colon: Secondary | ICD-10-CM | POA: Diagnosis not present

## 2021-01-15 LAB — HM COLONOSCOPY

## 2021-01-19 ENCOUNTER — Other Ambulatory Visit: Payer: Self-pay | Admitting: Gastroenterology

## 2021-01-19 DIAGNOSIS — Z9889 Other specified postprocedural states: Secondary | ICD-10-CM

## 2021-02-02 ENCOUNTER — Other Ambulatory Visit (HOSPITAL_COMMUNITY): Payer: Self-pay

## 2021-02-05 ENCOUNTER — Encounter: Payer: Self-pay | Admitting: Internal Medicine

## 2021-03-11 DIAGNOSIS — H2513 Age-related nuclear cataract, bilateral: Secondary | ICD-10-CM | POA: Diagnosis not present

## 2021-03-23 ENCOUNTER — Other Ambulatory Visit: Payer: Self-pay

## 2021-03-23 ENCOUNTER — Ambulatory Visit (INDEPENDENT_AMBULATORY_CARE_PROVIDER_SITE_OTHER): Payer: 59 | Admitting: Internal Medicine

## 2021-03-23 ENCOUNTER — Encounter: Payer: Self-pay | Admitting: Internal Medicine

## 2021-03-23 VITALS — BP 126/70 | HR 79 | Temp 97.8°F | Resp 16 | Ht 62.0 in | Wt 186.0 lb

## 2021-03-23 DIAGNOSIS — K649 Unspecified hemorrhoids: Secondary | ICD-10-CM | POA: Diagnosis not present

## 2021-03-23 DIAGNOSIS — F439 Reaction to severe stress, unspecified: Secondary | ICD-10-CM | POA: Diagnosis not present

## 2021-03-23 DIAGNOSIS — Z86010 Personal history of colonic polyps: Secondary | ICD-10-CM | POA: Diagnosis not present

## 2021-03-23 DIAGNOSIS — I10 Essential (primary) hypertension: Secondary | ICD-10-CM | POA: Diagnosis not present

## 2021-03-23 DIAGNOSIS — Z0000 Encounter for general adult medical examination without abnormal findings: Secondary | ICD-10-CM

## 2021-03-23 DIAGNOSIS — Z13220 Encounter for screening for lipoid disorders: Secondary | ICD-10-CM

## 2021-03-23 DIAGNOSIS — Z1231 Encounter for screening mammogram for malignant neoplasm of breast: Secondary | ICD-10-CM | POA: Diagnosis not present

## 2021-03-23 DIAGNOSIS — K219 Gastro-esophageal reflux disease without esophagitis: Secondary | ICD-10-CM | POA: Diagnosis not present

## 2021-03-23 MED ORDER — HYDROCORTISONE ACETATE 25 MG RE SUPP
25.0000 mg | Freq: Two times a day (BID) | RECTAL | 0 refills | Status: DC
  Filled 2021-03-23: qty 12, 6d supply, fill #0

## 2021-03-23 NOTE — Progress Notes (Signed)
Patient ID: Kristy Vega, female   DOB: Oct 02, 1960, 60 y.o.   MRN: [id number]   Subjective:    Patient ID: Kristy Vega, female    DOB: 1961-03-10, 60 y.o.   MRN: [id number]  This visit occurred during the SARS-CoV-2 public health emergency.  Safety protocols were in place, including screening questions prior to the visit, additional usage of staff PPE, and extensive cleaning of exam room while observing appropriate contact time as indicated for disinfecting solutions.   Patient here for her physical exam.   Chief Complaint  Patient presents with   Annual Exam   .   HPI Reports she is doing relatively well.  Tries to stay active.  No chest pain or sob reported.  Discussed pepcid - acid reflux.  No increased congestion.  No abdominal pain.  Hemorrhoid - intermittent bleeding.  Discussed referral to surgery for evaluation - banding.  Will notify me if desires any further intervention.  Discussed anusol suppositories.  Saw GI.  Incomplete colonoscopy.  Recommended CT colonography.  States has not been scheduled.  Discussed knees - PT.  Will notify me if feels needs therapy.     Past Medical History:  Diagnosis Date   History of colon polyps    Vaginal delivery    X 2   Past Surgical History:  Procedure Laterality Date   ABDOMINAL HYSTERECTOMY     Dr Josefa Half, for menorrhagia   CHOLECYSTECTOMY     INCISION / DRAINAGE HAND / FINGER     removal of sewing needle   Family History  Problem Relation Age of Onset   Hypertension Mother    Cancer Father        prostate   Stroke Maternal Grandfather    Breast cancer Neg Hx    Social History   Socioeconomic History   Marital status: Married    Spouse name: Not on file   Number of children: Not on file   Years of education: Not on file   Highest education level: Not on file  Occupational History   Not on file  Tobacco Use   Smoking status: Never   Smokeless tobacco: Never  Substance and Sexual Activity   Alcohol use: Yes     Alcohol/week: 0.0 standard drinks    Comment: occasional   Drug use: Not on file   Sexual activity: Not on file  Other Topics Concern   Not on file  Social History Narrative   Exercises regularly      Animals: Dogs and horses   Social Determinants of Health   Financial Resource Strain: Not on file  Food Insecurity: Not on file  Transportation Needs: Not on file  Physical Activity: Not on file  Stress: Not on file  Social Connections: Not on file     Review of Systems  Constitutional:  Negative for appetite change and unexpected weight change.  HENT:  Negative for congestion, sinus pressure and sore throat.   Eyes:  Negative for pain and visual disturbance.  Respiratory:  Negative for cough, chest tightness and shortness of breath.   Cardiovascular:  Negative for chest pain, palpitations and leg swelling.  Gastrointestinal:  Negative for abdominal pain, diarrhea, nausea and vomiting.  Genitourinary:  Negative for difficulty urinating and dysuria.  Musculoskeletal:  Negative for joint swelling and myalgias.  Skin:  Negative for color change and rash.  Neurological:  Negative for dizziness, light-headedness and headaches.  Hematological:  Negative for adenopathy. Does not bruise/bleed easily.  Psychiatric/Behavioral:  Negative for agitation and dysphoric mood.       Objective:     BP 126/70   Pulse 79   Temp 97.8 F (36.6 C)   Resp 16   Ht 5' 2"  (1.575 m)   Wt 186 lb (84.4 kg)   SpO2 98%   BMI 34.02 kg/m  Wt Readings from Last 3 Encounters:  03/23/21 186 lb (84.4 kg)  01/31/20 179 lb (81.2 kg)  09/13/19 179 lb (81.2 kg)    Physical Exam Vitals reviewed.  Constitutional:      General: She is not in acute distress.    Appearance: Normal appearance. She is well-developed.  HENT:     Head: Normocephalic and atraumatic.     Right Ear: External ear normal.     Left Ear: External ear normal.  Eyes:     General: No scleral icterus.       Right eye: No  discharge.        Left eye: No discharge.     Conjunctiva/sclera: Conjunctivae normal.  Neck:     Thyroid: No thyromegaly.  Cardiovascular:     Rate and Rhythm: Normal rate and regular rhythm.  Pulmonary:     Effort: No tachypnea, accessory muscle usage or respiratory distress.     Breath sounds: Normal breath sounds. No decreased breath sounds or wheezing.  Chest:  Breasts:    Right: No inverted nipple, mass, nipple discharge or tenderness (no axillary adenopathy).     Left: No inverted nipple, mass, nipple discharge or tenderness (no axilarry adenopathy).  Abdominal:     General: Bowel sounds are normal.     Palpations: Abdomen is soft.     Tenderness: There is no abdominal tenderness.  Musculoskeletal:        General: No swelling or tenderness.     Cervical back: Neck supple.  Lymphadenopathy:     Cervical: No cervical adenopathy.  Skin:    Findings: No erythema or rash.  Neurological:     Mental Status: She is alert and oriented to person, place, and time.  Psychiatric:        Mood and Affect: Mood normal.        Behavior: Behavior normal.     Outpatient Encounter Medications as of 03/23/2021  Medication Sig   hydrocortisone (ANUSOL-HC) 25 MG suppository Place 1 suppository (25 mg total) rectally 2 (two) times daily.   Ascorbic Acid (VITAMIN C PO) Take by mouth.   Cholecalciferol (VITAMIN D3 PO) Take by mouth.   clindamycin (CLEOCIN-T) 1 % lotion Apply to affected areas on the face twice a day   hydrochlorothiazide (MICROZIDE) 12.5 MG capsule Take one tablet by mouth daily with losartan.   losartan (COZAAR) 100 MG tablet TAKE 1 TABLET BY MOUTH DAILY WITH HCTZ   metoprolol succinate (TOPROL-XL) 100 MG 24 hr tablet TAKE 1 TABLET BY MOUTH ONCE A DAY WITH OR IMMEDIATELY FOLLOWING A MEAL   Multiple Vitamin (MULTIVITAMIN) tablet Take 1 tablet by mouth daily.   pimecrolimus (ELIDEL) 1 % cream Apply to affected areas on the face twice a day, until rash is cleared   Zinc 30 MG  CAPS Take by mouth.   [DISCONTINUED] Na Sulfate-K Sulfate-Mg Sulf (SUPREP BOWEL PREP KIT) 17.5-3.13-1.6 GM/177ML SOLN Take 1 Bottle by mouth as directed One kit contains 2 bottles.  Take both bottles at the times instructed by your provider.   No facility-administered encounter medications on file as of 03/23/2021.     Lab Results  Component  Value Date   WBC 5.2 01/29/2020   HGB 13.9 01/29/2020   HCT 40.2 01/29/2020   PLT 274.0 01/29/2020   GLUCOSE 92 01/29/2020   CHOL 191 01/29/2020   TRIG 142.0 01/29/2020   HDL 62.10 01/29/2020   LDLDIRECT 122.8 07/24/2012   LDLCALC 100 (H) 01/29/2020   ALT 12 01/29/2020   AST 15 01/29/2020   NA 137 01/29/2020   K 3.8 01/29/2020   CL 99 01/29/2020   CREATININE 0.67 01/29/2020   BUN 12 01/29/2020   CO2 31 01/29/2020   TSH 3.03 01/29/2020   INR 1.0 11/22/2016   HGBA1C 5.5 02/16/2016   MICROALBUR <0.7 09/02/2014    US BREAST LTD UNI LEFT INC AXILLA  Result Date: 03/10/2020 CLINICAL DATA:  Patient returns today to evaluate a possible LEFT breast mass questioned on recent screening mammogram. EXAM: DIGITAL DIAGNOSTIC LEFT MAMMOGRAM WITH CAD AND TOMO ULTRASOUND LEFT BREAST COMPARISON:  Previous exams including recent screening mammogram dated 02/26/2020. ACR Breast Density Category c: The breast tissue is heterogeneously dense, which may obscure small masses. FINDINGS: On today's additional diagnostic views, an oval circumscribed low-density mass is confirmed within the upper LEFT breast, anterior to middle depth, measuring approximately 6 mm greatest dimension. Mammographic images were processed with CAD. Targeted ultrasound is performed, showing multiple benign cysts within the LEFT breast at the 12 o'clock axis, largest measuring 7 mm, corresponding to the mammographic findings. No suspicious solid or cystic mass is identified within the upper LEFT breast. IMPRESSION: No evidence of malignancy. Multiple benign cysts within the upper LEFT breast,  largest measuring 7 mm, corresponding to the mammographic findings. Patient may return to routine annual bilateral screening mammogram schedule. RECOMMENDATION: Screening mammogram in one year.(Code:SM-B-01Y) I have discussed the findings and recommendations with the patient. If applicable, a reminder letter will be sent to the patient regarding the next appointment. BI-RADS CATEGORY  2: Benign. Electronically Signed   By: Franki Cabot M.D.   On: 03/10/2020 11:57   MM DIAG BREAST TOMO UNI LEFT  Result Date: 03/10/2020 CLINICAL DATA:  Patient returns today to evaluate a possible LEFT breast mass questioned on recent screening mammogram. EXAM: DIGITAL DIAGNOSTIC LEFT MAMMOGRAM WITH CAD AND TOMO ULTRASOUND LEFT BREAST COMPARISON:  Previous exams including recent screening mammogram dated 02/26/2020. ACR Breast Density Category c: The breast tissue is heterogeneously dense, which may obscure small masses. FINDINGS: On today's additional diagnostic views, an oval circumscribed low-density mass is confirmed within the upper LEFT breast, anterior to middle depth, measuring approximately 6 mm greatest dimension. Mammographic images were processed with CAD. Targeted ultrasound is performed, showing multiple benign cysts within the LEFT breast at the 12 o'clock axis, largest measuring 7 mm, corresponding to the mammographic findings. No suspicious solid or cystic mass is identified within the upper LEFT breast. IMPRESSION: No evidence of malignancy. Multiple benign cysts within the upper LEFT breast, largest measuring 7 mm, corresponding to the mammographic findings. Patient may return to routine annual bilateral screening mammogram schedule. RECOMMENDATION: Screening mammogram in one year.(Code:SM-B-01Y) I have discussed the findings and recommendations with the patient. If applicable, a reminder letter will be sent to the patient regarding the next appointment. BI-RADS CATEGORY  2: Benign. Electronically Signed   By:  Franki Cabot M.D.   On: 03/10/2020 11:57       Assessment & Plan:   Problem List Items Addressed This Visit     GERD (gastroesophageal reflux disease)    pepcid as directed.  Call with update.  Health care maintenance    Physical today 03/23/21.  PAP 09/13/19-  Negative with negative HPV.  Colonoscopy 01/2021.  Recommended CT colonography.  Mammogram scheduled for 04/08/21.        Hemorrhoid    Discussed treatment.  anusol HC suppositories.  Discussed banding.  Will notify me if desires surgery referral.       History of colonic polyps    Had recent incomplete colonoscopy.  Recommended colonography.  Waiting to be scheduled.  Contact GI for f/u.       Stress    Appears to be handling things relatively well.  Follow.       Essential hypertension, benign (Chronic)    Blood pressure has been doing well on losartan, hctz and metoprolol.  Follow pressures.  Follow metabolic panel.       Relevant Orders   CBC with Differential/Platelet   Comprehensive metabolic panel   TSH   Other Visit Diagnoses     Routine general medical examination at a health care facility    -  Primary   Visit for screening mammogram       Relevant Orders   MM 3D SCREEN BREAST BILATERAL   Screening cholesterol level       Relevant Orders   Lipid panel        Einar Pheasant, MD

## 2021-03-23 NOTE — Patient Instructions (Signed)
Pepcid 20mg  - take one tablet 30 minutes before breakfast  Call with update

## 2021-03-23 NOTE — Assessment & Plan Note (Addendum)
Physical today 03/23/21.  PAP 09/13/19-  Negative with negative HPV.  Colonoscopy 01/2021.  Recommended CT colonography.  Mammogram scheduled for 04/08/21.

## 2021-03-25 ENCOUNTER — Other Ambulatory Visit: Payer: Self-pay

## 2021-03-26 ENCOUNTER — Other Ambulatory Visit: Payer: Self-pay

## 2021-03-26 MED ORDER — TETANUS-DIPHTH-ACELL PERTUSSIS 5-2.5-18.5 LF-MCG/0.5 IM SUSP
INTRAMUSCULAR | 0 refills | Status: DC
Start: 2021-03-26 — End: 2021-09-21
  Filled 2021-03-26 – 2021-03-31 (×2): qty 0.5, 1d supply, fill #0

## 2021-03-28 ENCOUNTER — Encounter: Payer: Self-pay | Admitting: Internal Medicine

## 2021-03-28 DIAGNOSIS — K219 Gastro-esophageal reflux disease without esophagitis: Secondary | ICD-10-CM | POA: Insufficient documentation

## 2021-03-28 DIAGNOSIS — K649 Unspecified hemorrhoids: Secondary | ICD-10-CM | POA: Insufficient documentation

## 2021-03-28 NOTE — Assessment & Plan Note (Signed)
Discussed treatment.  anusol HC suppositories.  Discussed banding.  Will notify me if desires surgery referral.

## 2021-03-28 NOTE — Assessment & Plan Note (Signed)
pepcid as directed.  Call with update.

## 2021-03-28 NOTE — Assessment & Plan Note (Signed)
Blood pressure has been doing well on losartan, hctz and metoprolol.  Follow pressures.  Follow metabolic panel.

## 2021-03-28 NOTE — Assessment & Plan Note (Signed)
Had recent incomplete colonoscopy.  Recommended colonography.  Waiting to be scheduled.  Contact GI for f/u.

## 2021-03-28 NOTE — Assessment & Plan Note (Signed)
Appears to be handling things relatively well.  Follow.  

## 2021-03-31 ENCOUNTER — Encounter: Payer: Self-pay | Admitting: Internal Medicine

## 2021-03-31 ENCOUNTER — Other Ambulatory Visit: Payer: Self-pay

## 2021-04-08 ENCOUNTER — Other Ambulatory Visit: Payer: Self-pay

## 2021-04-08 ENCOUNTER — Ambulatory Visit
Admission: RE | Admit: 2021-04-08 | Discharge: 2021-04-08 | Disposition: A | Discharge status: Home / Self Care | Payer: 59 | Source: Ambulatory Visit | Attending: Internal Medicine | Admitting: Internal Medicine

## 2021-04-08 DIAGNOSIS — Z1231 Encounter for screening mammogram for malignant neoplasm of breast: Secondary | ICD-10-CM | POA: Insufficient documentation

## 2021-04-10 ENCOUNTER — Other Ambulatory Visit (HOSPITAL_COMMUNITY): Payer: Self-pay

## 2021-04-10 ENCOUNTER — Other Ambulatory Visit: Payer: Self-pay | Admitting: Internal Medicine

## 2021-04-10 ENCOUNTER — Encounter: Payer: Self-pay | Admitting: Internal Medicine

## 2021-04-10 MED ORDER — HYDROCHLOROTHIAZIDE 12.5 MG PO CAPS
ORAL_CAPSULE | ORAL | 1 refills | Status: DC
Start: 2021-04-10 — End: 2021-10-08
  Filled 2021-04-10: qty 90, 90d supply, fill #0
  Filled 2021-07-10: qty 90, 90d supply, fill #1

## 2021-04-27 ENCOUNTER — Other Ambulatory Visit (INDEPENDENT_AMBULATORY_CARE_PROVIDER_SITE_OTHER): Payer: 59

## 2021-04-27 ENCOUNTER — Other Ambulatory Visit: Payer: Self-pay

## 2021-04-27 DIAGNOSIS — I10 Essential (primary) hypertension: Secondary | ICD-10-CM | POA: Diagnosis not present

## 2021-04-27 DIAGNOSIS — Z13220 Encounter for screening for lipoid disorders: Secondary | ICD-10-CM | POA: Diagnosis not present

## 2021-04-27 LAB — CBC WITH DIFFERENTIAL/PLATELET
Basophils Absolute: 0 10*3/uL (ref 0.0–0.1)
Basophils Relative: 0.4 % (ref 0.0–3.0)
Eosinophils Absolute: 0.2 10*3/uL (ref 0.0–0.7)
Eosinophils Relative: 3.7 % (ref 0.0–5.0)
HCT: 43 % (ref 36.0–46.0)
Hemoglobin: 14.3 g/dL (ref 12.0–15.0)
Lymphocytes Relative: 37.4 % (ref 12.0–46.0)
Lymphs Abs: 2.1 10*3/uL (ref 0.7–4.0)
MCHC: 33.2 g/dL (ref 30.0–36.0)
MCV: 96.8 fl (ref 78.0–100.0)
Monocytes Absolute: 0.4 10*3/uL (ref 0.1–1.0)
Monocytes Relative: 7.4 % (ref 3.0–12.0)
Neutro Abs: 2.9 10*3/uL (ref 1.4–7.7)
Neutrophils Relative %: 51.1 % (ref 43.0–77.0)
Platelets: 270 10*3/uL (ref 150.0–400.0)
RBC: 4.44 Mil/uL (ref 3.87–5.11)
RDW: 12.3 % (ref 11.5–15.5)
WBC: 5.6 10*3/uL (ref 4.0–10.5)

## 2021-04-27 LAB — LIPID PANEL
Cholesterol: 177 mg/dL (ref 0–200)
HDL: 63.1 mg/dL (ref >39.00)
LDL Cholesterol: 93 mg/dL (ref 0–99)
NonHDL: 114.08
Total CHOL/HDL Ratio: 3
Triglycerides: 107 mg/dL (ref 0.0–149.0)
VLDL: 21.4 mg/dL (ref 0.0–40.0)

## 2021-04-27 LAB — COMPREHENSIVE METABOLIC PANEL
ALT: 17 U/L (ref 0–35)
AST: 19 U/L (ref 0–37)
Albumin: 4 g/dL (ref 3.5–5.2)
Alkaline Phosphatase: 71 U/L (ref 39–117)
BUN: 14 mg/dL (ref 6–23)
CO2: 29 mEq/L (ref 19–32)
Calcium: 9.2 mg/dL (ref 8.4–10.5)
Chloride: 101 mEq/L (ref 96–112)
Creatinine, Ser: 0.68 mg/dL (ref 0.40–1.20)
GFR: 93.54 mL/min (ref >60.00)
Glucose, Bld: 99 mg/dL (ref 70–99)
Potassium: 4.2 mEq/L (ref 3.5–5.1)
Sodium: 139 mEq/L (ref 135–145)
Total Bilirubin: 0.6 mg/dL (ref 0.2–1.2)
Total Protein: 7.3 g/dL (ref 6.0–8.3)

## 2021-04-27 LAB — TSH: TSH: 3.69 u[IU]/mL (ref 0.35–5.50)

## 2021-05-01 ENCOUNTER — Other Ambulatory Visit: Payer: Self-pay | Admitting: Internal Medicine

## 2021-05-01 ENCOUNTER — Other Ambulatory Visit (HOSPITAL_COMMUNITY): Payer: Self-pay

## 2021-05-01 MED ORDER — METOPROLOL SUCCINATE ER 100 MG PO TB24
ORAL_TABLET | ORAL | 1 refills | Status: DC
  Filled 2021-05-01: qty 90, 90d supply, fill #0
  Filled 2021-07-10: qty 90, 90d supply, fill #1

## 2021-07-10 ENCOUNTER — Other Ambulatory Visit (HOSPITAL_COMMUNITY): Payer: Self-pay

## 2021-07-13 ENCOUNTER — Other Ambulatory Visit (HOSPITAL_COMMUNITY): Payer: Self-pay

## 2021-09-21 ENCOUNTER — Encounter: Payer: Self-pay | Admitting: Internal Medicine

## 2021-09-21 ENCOUNTER — Ambulatory Visit: Payer: 59 | Admitting: Internal Medicine

## 2021-09-21 DIAGNOSIS — I10 Essential (primary) hypertension: Secondary | ICD-10-CM

## 2021-09-21 DIAGNOSIS — Z860100 Personal history of colon polyps, unspecified: Secondary | ICD-10-CM

## 2021-09-21 DIAGNOSIS — F439 Reaction to severe stress, unspecified: Secondary | ICD-10-CM

## 2021-09-21 NOTE — Progress Notes (Unsigned)
Patient ID: KRISTYNE WOODRING, female   DOB: [birth year], 61 y.o.   MRN: [id number]   Subjective:    Patient ID: Darlin Drop, female    DOB: May 27, 1960, 61 y.o.   MRN: [id number]  This visit occurred during the SARS-CoV-2 public health emergency.  Safety protocols were in place, including screening questions prior to the visit, additional usage of staff PPE, and extensive cleaning of exam room while observing appropriate contact time as indicated for disinfecting solutions.   Patient here for  Chief Complaint  Patient presents with   Follow-up    6 mo   .   HPI    Past Medical History:  Diagnosis Date   History of colon polyps    Vaginal delivery    X 2   Past Surgical History:  Procedure Laterality Date   ABDOMINAL HYSTERECTOMY     Dr Josefa Half, for menorrhagia   CHOLECYSTECTOMY     INCISION / DRAINAGE HAND / FINGER     removal of sewing needle   Family History  Problem Relation Age of Onset   Hypertension Mother    Cancer Father        prostate   Stroke Maternal Grandfather    Breast cancer Neg Hx    Social History   Socioeconomic History   Marital status: Married    Spouse name: Not on file   Number of children: Not on file   Years of education: Not on file   Highest education level: Not on file  Occupational History   Not on file  Tobacco Use   Smoking status: Never   Smokeless tobacco: Never  Substance and Sexual Activity   Alcohol use: Yes    Alcohol/week: 0.0 standard drinks of alcohol    Comment: occasional   Drug use: Not on file   Sexual activity: Not on file  Other Topics Concern   Not on file  Social History Narrative   Exercises regularly      Animals: Dogs and horses   Social Determinants of Health   Financial Resource Strain: Not on file  Food Insecurity: Not on file  Transportation Needs: Not on file  Physical Activity: Not on file  Stress: Not on file  Social Connections: Not on file     Review of Systems     Objective:      There were no vitals taken for this visit. Wt Readings from Last 3 Encounters:  03/23/21 186 lb (84.4 kg)  01/31/20 179 lb (81.2 kg)  09/13/19 179 lb (81.2 kg)    Physical Exam   Outpatient Encounter Medications as of 09/21/2021  Medication Sig   Ascorbic Acid (VITAMIN C PO) Take by mouth.   Cholecalciferol (VITAMIN D3 PO) Take by mouth.   clindamycin (CLEOCIN-T) 1 % lotion Apply to affected areas on the face twice a day   hydrochlorothiazide (MICROZIDE) 12.5 MG capsule Take one capsule by mouth daily with losartan.   hydrocortisone (ANUSOL-HC) 25 MG suppository Place 1 suppository (25 mg total) rectally 2 (two) times daily.   losartan (COZAAR) 100 MG tablet TAKE 1 TABLET BY MOUTH DAILY WITH HCTZ   metoprolol succinate (TOPROL-XL) 100 MG 24 hr tablet TAKE 1 TABLET BY MOUTH ONCE A DAY WITH OR IMMEDIATELY FOLLOWING A MEAL   Multiple Vitamin (MULTIVITAMIN) tablet Take 1 tablet by mouth daily.   pimecrolimus (ELIDEL) 1 % cream Apply to affected areas on the face twice a day, until rash is cleared   Tdap (BOOSTRIX)  5-2.5-18.5 LF-MCG/0.5 injection Inject into the muscle.   Zinc 30 MG CAPS Take by mouth.   No facility-administered encounter medications on file as of 09/21/2021.     Lab Results  Component Value Date   WBC 5.6 04/27/2021   HGB 14.3 04/27/2021   HCT 43.0 04/27/2021   PLT 270.0 04/27/2021   GLUCOSE 99 04/27/2021   CHOL 177 04/27/2021   TRIG 107.0 04/27/2021   HDL 63.10 04/27/2021   LDLDIRECT 122.8 07/24/2012   LDLCALC 93 04/27/2021   ALT 17 04/27/2021   AST 19 04/27/2021   NA 139 04/27/2021   K 4.2 04/27/2021   CL 101 04/27/2021   CREATININE 0.68 04/27/2021   BUN 14 04/27/2021   CO2 29 04/27/2021   TSH 3.69 04/27/2021   INR 1.0 11/22/2016   HGBA1C 5.5 02/16/2016   MICROALBUR <0.7 09/02/2014    MM 3D SCREEN BREAST BILATERAL  Result Date: 04/09/2021 CLINICAL DATA:  Screening. EXAM: DIGITAL SCREENING BILATERAL MAMMOGRAM WITH TOMOSYNTHESIS AND CAD  TECHNIQUE: Bilateral screening digital craniocaudal and mediolateral oblique mammograms were obtained. Bilateral screening digital breast tomosynthesis was performed. The images were evaluated with computer-aided detection. COMPARISON:  Previous exam(s). ACR Breast Density Category b: There are scattered areas of fibroglandular density. FINDINGS: There are no findings suspicious for malignancy. IMPRESSION: No mammographic evidence of malignancy. A result letter of this screening mammogram will be mailed directly to the patient. RECOMMENDATION: Screening mammogram in one year. (Code:SM-B-01Y) BI-RADS CATEGORY  1: Negative. Electronically Signed   By: Ammie Ferrier M.D.   On: 04/09/2021 11:03      Assessment & Plan:   Problem List Items Addressed This Visit   None    Einar Pheasant, MD

## 2021-10-04 ENCOUNTER — Encounter: Payer: Self-pay | Admitting: Internal Medicine

## 2021-10-08 ENCOUNTER — Other Ambulatory Visit (HOSPITAL_COMMUNITY): Payer: Self-pay

## 2021-10-08 ENCOUNTER — Other Ambulatory Visit: Payer: Self-pay | Admitting: Internal Medicine

## 2021-10-08 MED ORDER — HYDROCHLOROTHIAZIDE 12.5 MG PO CAPS
ORAL_CAPSULE | ORAL | 1 refills | Status: DC
  Filled 2021-10-08: qty 90, 90d supply, fill #0
  Filled 2022-01-08: qty 90, 90d supply, fill #1

## 2021-10-08 MED ORDER — LOSARTAN POTASSIUM 100 MG PO TABS
ORAL_TABLET | ORAL | 2 refills | Status: DC
  Filled 2021-10-08: qty 90, 90d supply, fill #0

## 2021-10-21 ENCOUNTER — Other Ambulatory Visit (INDEPENDENT_AMBULATORY_CARE_PROVIDER_SITE_OTHER): Payer: 59

## 2021-10-21 DIAGNOSIS — I10 Essential (primary) hypertension: Secondary | ICD-10-CM | POA: Diagnosis not present

## 2021-10-21 LAB — COMPREHENSIVE METABOLIC PANEL
ALT: 17 U/L (ref 0–35)
AST: 19 U/L (ref 0–37)
Albumin: 4 g/dL (ref 3.5–5.2)
Alkaline Phosphatase: 73 U/L (ref 39–117)
BUN: 13 mg/dL (ref 6–23)
CO2: 28 mEq/L (ref 19–32)
Calcium: 9 mg/dL (ref 8.4–10.5)
Chloride: 104 mEq/L (ref 96–112)
Creatinine, Ser: 0.63 mg/dL (ref 0.40–1.20)
GFR: 94.96 mL/min (ref >60.00)
Glucose, Bld: 97 mg/dL (ref 70–99)
Potassium: 3.8 mEq/L (ref 3.5–5.1)
Sodium: 139 mEq/L (ref 135–145)
Total Bilirubin: 0.6 mg/dL (ref 0.2–1.2)
Total Protein: 7.1 g/dL (ref 6.0–8.3)

## 2021-10-21 LAB — LIPID PANEL
Cholesterol: 193 mg/dL (ref 0–200)
HDL: 61.6 mg/dL (ref >39.00)
LDL Cholesterol: 104 mg/dL — ABNORMAL HIGH (ref 0–99)
NonHDL: 131.47
Total CHOL/HDL Ratio: 3
Triglycerides: 139 mg/dL (ref 0.0–149.0)
VLDL: 27.8 mg/dL (ref 0.0–40.0)

## 2021-10-22 ENCOUNTER — Ambulatory Visit: Payer: 59 | Admitting: Dermatology

## 2021-10-26 ENCOUNTER — Other Ambulatory Visit: Payer: Self-pay | Admitting: Internal Medicine

## 2021-10-26 ENCOUNTER — Other Ambulatory Visit (HOSPITAL_COMMUNITY): Payer: Self-pay

## 2021-10-26 MED ORDER — METOPROLOL SUCCINATE ER 100 MG PO TB24
ORAL_TABLET | ORAL | 1 refills | Status: DC
Start: 2021-10-26 — End: 2022-04-20
  Filled 2021-10-26: qty 90, 90d supply, fill #0
  Filled 2022-01-08 – 2022-01-11 (×2): qty 90, 90d supply, fill #1

## 2021-10-28 ENCOUNTER — Encounter: Payer: Self-pay | Admitting: Internal Medicine

## 2021-10-28 NOTE — Telephone Encounter (Signed)
Please call and notify that I reviewed her blood pressures.  Overall most - 120-low 130s .  Have her to continue to monitor and send in readings.  Will hold on making changes at this time.  Will continue to follow.

## 2021-10-28 NOTE — Telephone Encounter (Signed)
Pt advised.

## 2021-11-18 ENCOUNTER — Encounter: Payer: Self-pay | Admitting: Dermatology

## 2021-11-18 ENCOUNTER — Other Ambulatory Visit (HOSPITAL_COMMUNITY): Payer: Self-pay

## 2021-11-18 ENCOUNTER — Ambulatory Visit (INDEPENDENT_AMBULATORY_CARE_PROVIDER_SITE_OTHER): Payer: 59 | Admitting: Dermatology

## 2021-11-18 DIAGNOSIS — S80862A Insect bite (nonvenomous), left lower leg, initial encounter: Secondary | ICD-10-CM | POA: Diagnosis not present

## 2021-11-18 DIAGNOSIS — I8393 Asymptomatic varicose veins of bilateral lower extremities: Secondary | ICD-10-CM

## 2021-11-18 DIAGNOSIS — L821 Other seborrheic keratosis: Secondary | ICD-10-CM

## 2021-11-18 DIAGNOSIS — D1721 Benign lipomatous neoplasm of skin and subcutaneous tissue of right arm: Secondary | ICD-10-CM

## 2021-11-18 DIAGNOSIS — L304 Erythema intertrigo: Secondary | ICD-10-CM | POA: Diagnosis not present

## 2021-11-18 DIAGNOSIS — L814 Other melanin hyperpigmentation: Secondary | ICD-10-CM

## 2021-11-18 DIAGNOSIS — D229 Melanocytic nevi, unspecified: Secondary | ICD-10-CM

## 2021-11-18 DIAGNOSIS — D1800 Hemangioma unspecified site: Secondary | ICD-10-CM

## 2021-11-18 DIAGNOSIS — L300 Nummular dermatitis: Secondary | ICD-10-CM

## 2021-11-18 DIAGNOSIS — W57XXXA Bitten or stung by nonvenomous insect and other nonvenomous arthropods, initial encounter: Secondary | ICD-10-CM | POA: Diagnosis not present

## 2021-11-18 DIAGNOSIS — L817 Pigmented purpuric dermatosis: Secondary | ICD-10-CM

## 2021-11-18 DIAGNOSIS — Z85828 Personal history of other malignant neoplasm of skin: Secondary | ICD-10-CM

## 2021-11-18 DIAGNOSIS — Z1283 Encounter for screening for malignant neoplasm of skin: Secondary | ICD-10-CM

## 2021-11-18 DIAGNOSIS — S80861A Insect bite (nonvenomous), right lower leg, initial encounter: Secondary | ICD-10-CM

## 2021-11-18 DIAGNOSIS — L578 Other skin changes due to chronic exposure to nonionizing radiation: Secondary | ICD-10-CM

## 2021-11-18 DIAGNOSIS — B351 Tinea unguium: Secondary | ICD-10-CM

## 2021-11-18 DIAGNOSIS — D225 Melanocytic nevi of trunk: Secondary | ICD-10-CM

## 2021-11-18 DIAGNOSIS — D492 Neoplasm of unspecified behavior of bone, soft tissue, and skin: Secondary | ICD-10-CM

## 2021-11-18 DIAGNOSIS — D224 Melanocytic nevi of scalp and neck: Secondary | ICD-10-CM | POA: Diagnosis not present

## 2021-11-18 MED ORDER — TAVABOROLE 5 % EX SOLN: CUTANEOUS | 11 refills | Status: DC

## 2021-11-18 MED ORDER — MOMETASONE FUROATE 0.1 % EX CREA
TOPICAL_CREAM | CUTANEOUS | 2 refills | Status: DC
  Filled 2021-11-18: qty 45, 14d supply, fill #0

## 2021-11-18 NOTE — Progress Notes (Signed)
Follow-Up Visit   Subjective  Kristy Vega is a 61 y.o. female who presents for the following: Annual Exam (HxBCC 06/08/2019. Right chin, Mohs at [facility], Dr. Lacinda Axon).  The patient presents for Total-Body Skin Exam (TBSE) for skin cancer screening and mole check.  The patient has spots, moles and lesions to be evaluated, some may be new or changing and the patient has concerns that these could be cancer.    The following portions of the chart were reviewed this encounter and updated as appropriate:      Review of Systems: No other skin or systemic complaints except as noted in HPI or Assessment and Plan.   Objective  Well appearing patient in no apparent distress; mood and affect are within normal limits.  A full examination was performed including scalp, head, eyes, ears, nose, lips, neck, chest, axillae, abdomen, back, buttocks, bilateral upper extremities, bilateral lower extremities, hands, feet, fingers, toes, fingernails, and toenails. All findings within normal limits unless otherwise noted below.  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 Mohs 2021 - No evidence of recurrence today - Recommend regular full body skin exams - Recommend daily broad spectrum sunscreen SPF 30+ to sun-exposed areas, reapply every 2 hours as needed.  - Call if any new or changing lesions are noted between office visits   Lentigines - Scattered tan macules - Due to sun exposure - Benign-appearing, observe - Recommend daily broad spectrum sunscreen SPF 30+ to sun-exposed areas, reapply every 2  hours as needed. - Call for any changes  Seborrheic Keratoses - Stuck-on, waxy, tan-brown papules and/or plaques  - Benign-appearing - Discussed benign etiology and prognosis. - Observe - Call for any changes  Melanocytic Nevi - Tan-brown and/or pink-flesh-colored symmetric macules and papules - Benign appearing on exam today - Observation - Call clinic for new or changing moles - Recommend daily use of broad spectrum spf 30+ sunscreen to sun-exposed areas.   Hemangiomas. Torso. - Red papules - Discussed benign nature - Observe - Call for any changes  Actinic Damage - Chronic condition, secondary to cumulative UV/sun exposure - diffuse scaly erythematous macules with underlying dyspigmentation - Recommend daily broad spectrum sunscreen SPF 30+ to sun-exposed areas, reapply every 2 hours as needed.  - Staying in the shade or wearing long sleeves, sun glasses (UVA+UVB protection) and wide brim hats (4-inch brim around the entire circumference of the hat) are also recommended for sun protection.  - Call for new or changing lesions.  Skin cancer screening performed today.   Sebaceous Hyperplasia. Face - Small yellow papules with a central dell - Benign - Observe  Varicose Veins/Spider Veins - Dilated blue, purple or red veins at the lower extremities - Reassured - Smaller vessels can be treated by sclerotherapy (a procedure to inject a medicine into the veins to make them disappear) if desired, but the treatment is not covered by insurance. Larger vessels may be covered if symptomatic and we would refer to vascular surgeon if treatment desired.  Milia. Right medial cheek - tiny firm white papules - type of  cyst - benign - may be extracted if symptomatic - observe   Nevus Lower Mid Back Spinal  Benign-appearing.  Observation.  Call clinic for new or changing lesions.  Recommend daily use of broad spectrum spf 30+ sunscreen to sun-exposed areas.    Lipoma of right  upper extremity Right Upper Arm - Anterior, right hand  Benign-appearing.  Observation.  Call clinic for new or changing lesions.  Recommend daily use of broad spectrum spf 30+ sunscreen to sun-exposed areas.    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 Condition is symptomatic / bothersome to patient. Not to goal.  Start Mometasone twice daily to affected areas as needed for itching.   Topical steroids (such as triamcinolone, fluocinolone, fluocinonide, mometasone, clobetasol, halobetasol, betamethasone, hydrocortisone) can cause thinning and lightening of the skin if they are used for too long in the same area. Your physician has selected the right strength medicine for your problem and area affected on the body. Please use your medication only as directed by your physician to prevent side effects.   Recommend mild soap and moisturizing cream 1-2 times daily.  Gentle skin care handout provided.     mometasone (ELOCON) 0.1 % cream - Right Foot - Anterior Apply twice daily to affected areas up to 2 weeks as needed for rash/itching  Schamberg's purpura B/L legs  Benign, observe.    Stasis in the legs causes chronic leg swelling, which may result in itchy or painful rashes, skin discoloration, skin texture changes, and sometimes ulceration.  Recommend daily graduated compression hose/stockings- easiest to put on first thing in morning, remove at bedtime.  Elevate legs as much as possible. Avoid salt/sodium rich foods.   Bug bite without infection, initial encounter legs  Benign, observe.    Apply mometasone twice daily as needed for itch  Neoplasm of skin Right Breast  Skin / nail biopsy Type of biopsy: tangential   Informed consent: discussed and consent obtained   Anesthesia: the lesion was anesthetized in a standard fashion   Anesthesia comment:  Area prepped with alcohol Anesthetic:  1%  lidocaine w/ epinephrine 1-100,000 buffered w/ 8.4% NaHCO3 Instrument used: flexible razor blade   Hemostasis achieved with: pressure, aluminum chloride and electrodesiccation   Outcome: patient tolerated procedure well   Post-procedure details: wound care instructions given   Post-procedure details comment:  Ointment and small bandage applied  Specimen 1 - Surgical pathology Differential Diagnosis: irritated skin tag vs irritated nevus  Check Margins: No  Intertrigo Inframammary Folds  With Pruritus.  Can use OTC Zeasorb AF powder once daily as maintenance to minimize sweating/skin irritation Can use mometasone cream qd x 3 days at a time prn itch.  Intertrigo is a chronic recurrent rash that occurs in skin fold areas that may be associated with friction; heat; moisture; yeast; fungus; and bacteria.  It is exacerbated by increased movement / activity; sweating; and higher atmospheric temperature.    Tinea unguium Right 3rd Toe Nail Plate  start Kerydin to affected toenails at bedtime.  Tavaborole (KERYDIN) 5 % SOLN - Right 3rd Toe Nail Plate Apply to affected toenails at bedtime   Return in about 1 year (around 11/19/2022) for TBSE, HxBCC.  I, Emelia Salisbury, CMA, am acting as scribe for Brendolyn Patty, MD.  Documentation: I have reviewed the above documentation for accuracy and completeness, and I agree with the above.  Brendolyn Patty MD

## 2021-11-18 NOTE — Patient Instructions (Addendum)
Foot, bug bites: Start Mometasone twice daily to affected areas as needed for itching.   Topical steroids (such as triamcinolone, fluocinolone, fluocinonide, mometasone, clobetasol, halobetasol, betamethasone, hydrocortisone) can cause thinning and lightening of the skin if they are used for too long in the same area. Your physician has selected the right strength medicine for your problem and area affected on the body. Please use your medication only as directed by your physician to prevent side effects.   Toenail:  Apply Kerydin to affected toenails at bedtime.  Chest:  Can use OTC Zeasorb AF powder once daily as maintenance.    Wound Care Instructions  Cleanse wound gently with soap and water once a day then pat dry with clean gauze. Apply a thin coat of Petrolatum (petroleum jelly, "Vaseline") over the wound (unless you have an allergy to this). We recommend that you use a new, sterile tube of Vaseline. Do not pick or remove scabs. Do not remove the yellow or white "healing tissue" from the base of the wound.  Cover the wound with fresh, clean, nonstick gauze and secure with paper tape. You may use Band-Aids in place of gauze and tape if the wound is small enough, but would recommend trimming much of the tape off as there is often too much. Sometimes Band-Aids can irritate the skin.  You should call the office for your biopsy report after 1 week if you have not already been contacted.  If you experience any problems, such as abnormal amounts of bleeding, swelling, significant bruising, significant pain, or evidence of infection, please call the office immediately.  FOR ADULT SURGERY PATIENTS: If you need something for pain relief you may take 1 extra strength Tylenol (acetaminophen) AND 2 Ibuprofen ('200mg'$  each) together every 4 hours as needed for pain. (do not take these if you are allergic to them or if you have a reason you should not take them.) Typically, you may only need pain  medication for 1 to 3 days.     Recommend daily broad spectrum sunscreen SPF 30+ to sun-exposed areas, reapply every 2 hours as needed. Call for new or changing lesions.  Staying in the shade or wearing long sleeves, sun glasses (UVA+UVB protection) and wide brim hats (4-inch brim around the entire circumference of the hat) are also recommended for sun protection.    Melanoma ABCDEs  Melanoma is the most dangerous type of skin cancer, and is the leading cause of death from skin disease.  You are more likely to develop melanoma if you: Have light-colored skin, light-colored eyes, or red or blond hair Spend a lot of time in the sun Tan regularly, either outdoors or in a tanning bed Have had blistering sunburns, especially during childhood Have a close family member who has had a melanoma Have atypical moles or large birthmarks  Early detection of melanoma is key since treatment is typically straightforward and cure rates are extremely high if we catch it early.   The first sign of melanoma is often a change in a mole or a new dark spot.  The ABCDE system is a way of remembering the signs of melanoma.  A for asymmetry:  The two halves do not match. B for border:  The edges of the growth are irregular. C for color:  A mixture of colors are present instead of an even brown color. D for diameter:  Melanomas are usually (but not always) greater than 37m - the size of a pencil eraser. E for evolution:  The spot keeps changing in size, shape, and color.  Please check your skin once per month between visits. You can use a small mirror in front and a large mirror behind you to keep an eye on the back side or your body.   If you see any new or changing lesions before your next follow-up, please call to schedule a visit.  Please continue daily skin protection including broad spectrum sunscreen SPF 30+ to sun-exposed areas, reapplying every 2 hours as needed when you're outdoors.   Staying in the  shade or wearing long sleeves, sun glasses (UVA+UVB protection) and wide brim hats (4-inch brim around the entire circumference of the hat) are also recommended for sun protection.     Due to recent changes in healthcare laws, you may see results of your pathology and/or laboratory studies on MyChart before the doctors have had a chance to review them. We understand that in some cases there may be results that are confusing or concerning to you. Please understand that not all results are received at the same time and often the doctors may need to interpret multiple results in order to provide you with the best plan of care or course of treatment. Therefore, we ask that you please give [place] 2 business days to thoroughly review all your results before contacting the office for clarification. Should we see a critical lab result, you will be contacted sooner.   If You Need Anything After Your Visit  If you have any questions or concerns for your doctor, please call our main line at [phone] and press option 4 to reach your doctor's medical assistant. If no one answers, please leave a voicemail as directed and we will return your call as soon as possible. Messages left after 4 pm will be answered the following business day.   You may also send [place] a message via [place]. We typically respond to MyChart messages within 1-2 business days.  For prescription refills, please ask your pharmacy to contact our office. Our fax number is [phone].  If you have an urgent issue when the clinic is closed that cannot wait until the next business day, you can page your doctor at the number below.    Please note that while we do our best to be available for urgent issues outside of office hours, we are not available 24/7.   If you have an urgent issue and are unable to reach [place], you may choose to seek medical care at your doctor's office, retail clinic, urgent care center, or emergency room.  If you have a  medical emergency, please immediately call 911 or go to the emergency department.  Pager Numbers  - Dr. Nehemiah Massed: [phone]  - Dr. Laurence Ferrari: [phone]  - Dr. Nicole Kindred: [phone]  In the event of inclement weather, please call our main line at [phone] for an update on the status of any delays or closures.  Dermatology Medication Tips: Please keep the boxes that topical medications come in in order to help keep track of the instructions about where and how to use these. Pharmacies typically print the medication instructions only on the boxes and not directly on the medication tubes.   If your medication is too expensive, please contact our office at [phone] option 4 or send [place] a message through [place].   We are unable to tell what your co-pay for medications will be in advance as this is different depending on your insurance coverage. However, we may be able to  find a substitute medication at lower cost or fill out paperwork to get insurance to cover a needed medication.   If a prior authorization is required to get your medication covered by your insurance company, please allow [place] 1-2 business days to complete this process.  Drug prices often vary depending on where the prescription is filled and some pharmacies may offer cheaper prices.  The website www.goodrx.com contains coupons for medications through different pharmacies. The prices here do not account for what the cost may be with help from insurance (it may be cheaper with your insurance), but the website can give you the price if you did not use any insurance.  - You can print the associated coupon and take it with your prescription to the pharmacy.  - You may also stop by our office during regular business hours and pick up a GoodRx coupon card.  - If you need your prescription sent electronically to a different pharmacy, notify our office through [facility] or by phone at [phone] option 4.     Si  Usted Necesita Algo Despus de Su Visita  Tambin puede enviarnos un mensaje a travs de Pharmacist, community. Por lo general respondemos a los mensajes de MyChart en el transcurso de 1 a 2 das hbiles.  Para renovar recetas, por favor pida a su farmacia que se ponga en contacto con nuestra oficina. Harland Dingwall de fax es [place] [phone].  Si tiene un asunto urgente cuando la clnica est cerrada y que no puede esperar hasta el siguiente da hbil, puede llamar/localizar a su doctor(a) al nmero que aparece a continuacin.   Por favor, tenga en cuenta que aunque hacemos todo lo posible para estar disponibles para asuntos urgentes fuera del horario de [place], no estamos disponibles las 24 horas del da, los 7 das de la [place].   Si tiene un problema urgente y no puede comunicarse con nosotros, puede optar por buscar atencin mdica  en el consultorio de su doctor(a), en una clnica privada, en un centro de atencin urgente o en una sala de emergencias.  Si tiene Engineering geologist, por favor llame inmediatamente al 911 o vaya a la sala de emergencias.  Nmeros de bper  - Dr. Nehemiah Massed: [phone]  - Dra. Moye: [phone]  - Dra. Nicole Kindred: [phone]  En caso de inclemencias del [place], por favor llame a Johnsie Kindred principal al [phone] para una actualizacin sobre el [place] de cualquier retraso o cierre.  Consejos para la medicacin en dermatologa: Por favor, guarde las cajas en las que vienen los medicamentos de uso tpico para ayudarle a seguir las instrucciones sobre dnde y cmo usarlos. Las farmacias generalmente imprimen las instrucciones del medicamento slo en las cajas y no directamente en los tubos del [place].   Si su medicamento es muy caro, por favor, pngase en contacto con Zigmund Daniel llamando al [phone] y presione la opcin 4 o envenos un mensaje a travs de Pharmacist, community.   No podemos decirle cul ser su copago por los medicamentos por adelantado ya que  esto es diferente dependiendo de la cobertura de su seguro. Sin embargo, es posible que podamos encontrar un medicamento sustituto a Electrical engineer un formulario para que el seguro cubra el medicamento que se considera necesario.   Si se requiere una autorizacin previa para que su compaa de seguros [place] su medicamento, por favor permtanos de 1 a 2 das hbiles para completar este proceso.  Los precios de los medicamentos varan con frecuencia dependiendo  del lugar de dnde se surte la receta y alguna farmacias pueden ofrecer precios ms baratos.  El sitio web www.goodrx.com tiene cupones para medicamentos de Airline pilot. Los precios aqu no tienen en cuenta lo que podra costar con la ayuda del seguro (puede ser ms barato con su seguro), pero el sitio web puede darle el precio si no utiliz Research scientist (physical sciences).  - Puede imprimir el cupn correspondiente y llevarlo con su receta a la farmacia.  - Tambin puede pasar por nuestra oficina durante el horario de atencin regular y Charity fundraiser una tarjeta de cupones de GoodRx.  - Si necesita que su receta se enve electrnicamente a una farmacia diferente, informe a nuestra oficina a travs de MyChart de [place] o por telfono llamando al [phone] y presione la opcin 4.

## 2021-11-19 ENCOUNTER — Other Ambulatory Visit (HOSPITAL_COMMUNITY): Payer: Self-pay

## 2021-11-23 ENCOUNTER — Telehealth: Payer: Self-pay

## 2021-11-23 NOTE — Telephone Encounter (Signed)
Patient advised biopsy of the right breast was a benign nevus and no further treatment needed.

## 2021-11-23 NOTE — Telephone Encounter (Signed)
-----   Message from Brendolyn Patty, MD sent at 11/23/2021  9:28 AM EDT ----- Skin , right breast MELANOCYTIC NEVUS, INTRADERMAL TYPE  Benign nevus   - please call patient

## 2021-12-22 ENCOUNTER — Ambulatory Visit: Payer: 59 | Admitting: Internal Medicine

## 2022-01-05 ENCOUNTER — Other Ambulatory Visit (HOSPITAL_COMMUNITY): Payer: Self-pay

## 2022-01-05 ENCOUNTER — Ambulatory Visit: Payer: 59 | Admitting: Internal Medicine

## 2022-01-05 ENCOUNTER — Encounter: Payer: Self-pay | Admitting: Internal Medicine

## 2022-01-05 DIAGNOSIS — Z23 Encounter for immunization: Secondary | ICD-10-CM

## 2022-01-05 DIAGNOSIS — F439 Reaction to severe stress, unspecified: Secondary | ICD-10-CM | POA: Diagnosis not present

## 2022-01-05 DIAGNOSIS — K649 Unspecified hemorrhoids: Secondary | ICD-10-CM

## 2022-01-05 DIAGNOSIS — I10 Essential (primary) hypertension: Secondary | ICD-10-CM

## 2022-01-05 DIAGNOSIS — Z86010 Personal history of colonic polyps: Secondary | ICD-10-CM | POA: Diagnosis not present

## 2022-01-05 MED ORDER — VALSARTAN 160 MG PO TABS
160.0000 mg | ORAL_TABLET | Freq: Every day | ORAL | 1 refills | Status: DC
  Filled 2022-01-05: qty 90, 90d supply, fill #0
  Filled 2022-04-10: qty 90, 90d supply, fill #1

## 2022-01-05 NOTE — Progress Notes (Signed)
Patient ID: Kristy Vega, female   DOB: 1961/01/06, 61 y.o.   MRN: [id number]   Subjective:    Patient ID: Kristy Vega, female    DOB: January 15, 1961, 61 y.o.   MRN: [id number]   Patient here for  Chief Complaint  Patient presents with   Follow-up    3 month follow up   .   HPI Here to follow up regarding blood pressure.  Also with history of hemorrhoids.  Saw GI.  Had incomplete colonoscopy.  Recommended CT colonography.  Discussed hemorrhoids.  Agreeable to surgery evaluation.  No chest pain or sob reported.  No abdominal pain.     Past Medical History:  Diagnosis Date   Basal cell carcinoma 06/08/2019   Right chin   History of colon polyps    Vaginal delivery    X 2   Past Surgical History:  Procedure Laterality Date   ABDOMINAL HYSTERECTOMY     Dr Josefa Half, for menorrhagia   CHOLECYSTECTOMY     INCISION / DRAINAGE HAND / FINGER     removal of sewing needle   Family History  Problem Relation Age of Onset   Hypertension Mother    Cancer Father        prostate   Stroke Maternal Grandfather    Breast cancer Neg Hx    Social History   Socioeconomic History   Marital status: Married    Spouse name: Not on file   Number of children: Not on file   Years of education: Not on file   Highest education level: Not on file  Occupational History   Not on file  Tobacco Use   Smoking status: Never   Smokeless tobacco: Never  Substance and Sexual Activity   Alcohol use: Yes    Alcohol/week: 0.0 standard drinks of alcohol    Comment: occasional   Drug use: Not on file   Sexual activity: Not on file  Other Topics Concern   Not on file  Social History Narrative   Exercises regularly      Animals: Dogs and horses   Social Determinants of Health   Financial Resource Strain: Not on file  Food Insecurity: Not on file  Transportation Needs: Not on file  Physical Activity: Not on file  Stress: Not on file  Social Connections: Not on file     Review of Systems   Constitutional:  Negative for appetite change and unexpected weight change.  HENT:  Negative for congestion and sinus pressure.   Respiratory:  Negative for cough, chest tightness and shortness of breath.   Cardiovascular:  Negative for chest pain, palpitations and leg swelling.  Gastrointestinal:  Negative for abdominal pain, nausea and vomiting.  Genitourinary:  Negative for difficulty urinating and dysuria.  Musculoskeletal:  Negative for joint swelling and myalgias.  Skin:  Negative for color change and rash.  Neurological:  Negative for dizziness, light-headedness and headaches.  Psychiatric/Behavioral:  Negative for agitation and dysphoric mood.        Objective:     BP 130/70 (BP Location: Left Arm, Patient Position: Sitting, Cuff Size: Normal)   Pulse 70   Temp (!) 97.4 F (36.3 C) (Oral)   Ht '5\' 2"'$  (1.575 m)   Wt 177 lb 12.8 oz (80.6 kg)   SpO2 96%   BMI 32.52 kg/m  Wt Readings from Last 3 Encounters:  01/05/22 177 lb 12.8 oz (80.6 kg)  09/21/21 177 lb (80.3 kg)  03/23/21 186 lb (84.4 kg)  Physical Exam Vitals reviewed.  Constitutional:      General: She is not in acute distress.    Appearance: Normal appearance.  HENT:     Head: Normocephalic and atraumatic.     Right Ear: External ear normal.     Left Ear: External ear normal.  Eyes:     General: No scleral icterus.       Right eye: No discharge.        Left eye: No discharge.     Conjunctiva/sclera: Conjunctivae normal.  Neck:     Thyroid: No thyromegaly.  Cardiovascular:     Rate and Rhythm: Normal rate and regular rhythm.  Pulmonary:     Effort: No respiratory distress.     Breath sounds: Normal breath sounds. No wheezing.  Abdominal:     General: Bowel sounds are normal.     Palpations: Abdomen is soft.     Tenderness: There is no abdominal tenderness.  Musculoskeletal:        General: No swelling or tenderness.     Cervical back: Neck supple. No tenderness.  Lymphadenopathy:      Cervical: No cervical adenopathy.  Skin:    Findings: No erythema or rash.  Neurological:     Mental Status: She is alert.  Psychiatric:        Mood and Affect: Mood normal.        Behavior: Behavior normal.      Outpatient Encounter Medications as of 01/05/2022  Medication Sig   hydrochlorothiazide (MICROZIDE) 12.5 MG capsule Take one capsule by mouth daily with losartan.   hydrocortisone (ANUSOL-HC) 25 MG suppository Place 1 suppository (25 mg total) rectally 2 (two) times daily.   metoprolol succinate (TOPROL-XL) 100 MG 24 hr tablet TAKE 1 TABLET BY MOUTH ONCE A DAY WITH OR IMMEDIATELY FOLLOWING A MEAL   mometasone (ELOCON) 0.1 % cream Apply twice daily to affected areas up to 2 weeks as needed for rash/itching   Multiple Vitamin (MULTIVITAMIN) tablet Take 1 tablet by mouth daily.   Tavaborole (KERYDIN) 5 % SOLN Apply to affected toenails at bedtime   valsartan (DIOVAN) 160 MG tablet Take 1 tablet (160 mg total) by mouth daily.   [DISCONTINUED] losartan (COZAAR) 100 MG tablet TAKE 1 TABLET BY MOUTH DAILY WITH HCTZ   No facility-administered encounter medications on file as of 01/05/2022.     Lab Results  Component Value Date   WBC 5.6 04/27/2021   HGB 14.3 04/27/2021   HCT 43.0 04/27/2021   PLT 270.0 04/27/2021   GLUCOSE 97 10/21/2021   CHOL 193 10/21/2021   TRIG 139.0 10/21/2021   HDL 61.60 10/21/2021   LDLDIRECT 122.8 07/24/2012   LDLCALC 104 (H) 10/21/2021   ALT 17 10/21/2021   AST 19 10/21/2021   NA 139 10/21/2021   K 3.8 10/21/2021   CL 104 10/21/2021   CREATININE 0.63 10/21/2021   BUN 13 10/21/2021   CO2 28 10/21/2021   TSH 3.69 04/27/2021   INR 1.0 11/22/2016   HGBA1C 5.5 02/16/2016   MICROALBUR <0.7 09/02/2014    MM 3D SCREEN BREAST BILATERAL  Result Date: 04/09/2021 CLINICAL DATA:  Screening. EXAM: DIGITAL SCREENING BILATERAL MAMMOGRAM WITH TOMOSYNTHESIS AND CAD TECHNIQUE: Bilateral screening digital craniocaudal and mediolateral oblique mammograms  were obtained. Bilateral screening digital breast tomosynthesis was performed. The images were evaluated with computer-aided detection. COMPARISON:  Previous exam(s). ACR Breast Density Category b: There are scattered areas of fibroglandular density. FINDINGS: There are no findings suspicious for malignancy. IMPRESSION:  No mammographic evidence of malignancy. A result letter of this screening mammogram will be mailed directly to the patient. RECOMMENDATION: Screening mammogram in one year. (Code:SM-B-01Y) BI-RADS CATEGORY  1: Negative. Electronically Signed   By: Ammie Ferrier M.D.   On: 04/09/2021 11:03      Assessment & Plan:   Problem List Items Addressed This Visit     Hemorrhoid    Discussed treatment.  Previous - anusol supp/cream.  Request Dr Bary Castilla to evaluate.        Relevant Medications   valsartan (DIOVAN) 160 MG tablet   Other Relevant Orders   Ambulatory referral to General Surgery   History of colonic polyps    Had recent incomplete colonoscopy.  Recommended colonography.  Is agreeable for f/u.  Had previously referred Dr Havery Moros [facility]. Discussed today.  Refer to Dr Bary Castilla regarding hemorrhoids.  May be able to f/u colonoscopy there as well.       Relevant Orders   Ambulatory referral to General Surgery   Stress    Increased stress. Work stress.  Discussed. Follow.       Essential hypertension, benign (Chronic)    Blood pressure as outlined.  On losartan, hctz and  Metoprolol.  Discussed adjusting medication.  Would like to minimize number of pills taking.  Will change losartan to diovan.  Will start '160mg'$ .  Check pressures and send in readings.  Titrate up to '320mg'$  if needed.  See if better blood pressure control with this medication regimen.        Relevant Medications   valsartan (DIOVAN) 160 MG tablet   Other Relevant Orders   CBC with Differential/Platelet   Comprehensive metabolic panel   TSH   Lipid panel     Einar Pheasant, MD

## 2022-01-05 NOTE — Patient Instructions (Signed)
Stop losartan.   Start valsartan.

## 2022-01-08 ENCOUNTER — Other Ambulatory Visit (HOSPITAL_COMMUNITY): Payer: Self-pay

## 2022-01-10 ENCOUNTER — Encounter: Payer: Self-pay | Admitting: Internal Medicine

## 2022-01-10 NOTE — Assessment & Plan Note (Signed)
Increased stress. Work stress.  Discussed. Follow.  

## 2022-01-10 NOTE — Assessment & Plan Note (Signed)
Had recent incomplete colonoscopy.  Recommended colonography.  Is agreeable for f/u.  Had previously referred Dr Havery Moros [facility]. Discussed today.  Refer to Dr Bary Castilla regarding hemorrhoids.  May be able to f/u colonoscopy there as well.

## 2022-01-10 NOTE — Assessment & Plan Note (Signed)
Discussed treatment.  Previous - anusol supp/cream.  Request Dr Bary Castilla to evaluate.

## 2022-01-10 NOTE — Assessment & Plan Note (Signed)
Blood pressure as outlined.  On losartan, hctz and  Metoprolol.  Discussed adjusting medication.  Would like to minimize number of pills taking.  Will change losartan to diovan.  Will start '160mg'$ .  Check pressures and send in readings.  Titrate up to '320mg'$  if needed.  See if better blood pressure control with this medication regimen.

## 2022-01-11 ENCOUNTER — Other Ambulatory Visit (HOSPITAL_COMMUNITY): Payer: Self-pay

## 2022-01-12 ENCOUNTER — Encounter: Payer: Self-pay | Admitting: Internal Medicine

## 2022-01-12 DIAGNOSIS — Z1211 Encounter for screening for malignant neoplasm of colon: Secondary | ICD-10-CM

## 2022-01-13 NOTE — Telephone Encounter (Signed)
Order placed for virtual colonography.

## 2022-01-14 ENCOUNTER — Telehealth: Payer: Self-pay | Admitting: Internal Medicine

## 2022-01-14 DIAGNOSIS — Z1211 Encounter for screening for malignant neoplasm of colon: Secondary | ICD-10-CM

## 2022-01-14 NOTE — Telephone Encounter (Signed)
My chart message sent to pt for GI referral

## 2022-01-15 NOTE — Addendum Note (Signed)
Addended by: Alisa Graff on: 01/15/2022 12:49 PM   Modules accepted: Orders

## 2022-01-15 NOTE — Telephone Encounter (Signed)
I have placed the order for the GI referral.  This is in f/u from the previous message - where I was trying to schedule the virtual colon.

## 2022-01-25 ENCOUNTER — Encounter: Payer: Self-pay | Admitting: Internal Medicine

## 2022-03-11 DIAGNOSIS — H2513 Age-related nuclear cataract, bilateral: Secondary | ICD-10-CM | POA: Diagnosis not present

## 2022-03-15 ENCOUNTER — Encounter: Payer: Self-pay | Admitting: Family

## 2022-03-15 ENCOUNTER — Ambulatory Visit: Payer: 59 | Admitting: Family

## 2022-03-15 ENCOUNTER — Ambulatory Visit (INDEPENDENT_AMBULATORY_CARE_PROVIDER_SITE_OTHER): Payer: 59

## 2022-03-15 VITALS — BP 140/78 | HR 68 | Temp 97.7°F | Wt 175.0 lb

## 2022-03-15 DIAGNOSIS — R059 Cough, unspecified: Secondary | ICD-10-CM | POA: Diagnosis not present

## 2022-03-15 DIAGNOSIS — J9811 Atelectasis: Secondary | ICD-10-CM | POA: Diagnosis not present

## 2022-03-15 DIAGNOSIS — R0781 Pleurodynia: Secondary | ICD-10-CM | POA: Diagnosis not present

## 2022-03-15 NOTE — Progress Notes (Signed)
Subjective:    Patient ID: Kristy Vega, female    DOB: February 15, 1961, 61 y.o.   MRN: [id number]  CC: Kristy Vega is a 61 y.o. female who presents today for an acute visit.    HPI: Coughed frequently for one week, 4 weeks ago. Associated nasal congestion. Home test negative for covid.  She tried advil cold and sinus without relief.   No fever, sob, CP  Cough returned a couple of weeks ago and now very intermittent cough.   She describes 'soreness' around back and sides.   She feels 'like she has worked out'. Right side soreness has improved.  Ibuprofen with relief.  Can feel soreness if twisting or takes a deep breath.   She works in Dermatology    History of hypertension, GERD. Never smoker HISTORY:  Past Medical History:  Diagnosis Date   Basal cell carcinoma 06/08/2019   Right chin   History of colon polyps    Vaginal delivery    X 2   Past Surgical History:  Procedure Laterality Date   ABDOMINAL HYSTERECTOMY     Dr Josefa Half, for menorrhagia   CHOLECYSTECTOMY     INCISION / DRAINAGE HAND / FINGER     removal of sewing needle   Family History  Problem Relation Age of Onset   Hypertension Mother    Cancer Father        prostate   Stroke Maternal Grandfather    Breast cancer Neg Hx     Allergies: Penicillins Current Outpatient Medications on File Prior to Visit  Medication Sig Dispense Refill   hydrochlorothiazide (MICROZIDE) 12.5 MG capsule Take one capsule by mouth daily with losartan. 90 capsule 1   hydrocortisone (ANUSOL-HC) 25 MG suppository Place 1 suppository (25 mg total) rectally 2 (two) times daily. 12 suppository 0   metoprolol succinate (TOPROL-XL) 100 MG 24 hr tablet TAKE 1 TABLET BY MOUTH ONCE A DAY WITH OR IMMEDIATELY FOLLOWING A MEAL 90 tablet 1   mometasone (ELOCON) 0.1 % cream Apply twice daily to affected areas up to 2 weeks as needed for rash/itching 45 g 2   Multiple Vitamin (MULTIVITAMIN) tablet Take 1 tablet by mouth daily.      Tavaborole (KERYDIN) 5 % SOLN Apply to affected toenails at bedtime 10 mL 11   valsartan (DIOVAN) 160 MG tablet Take 1 tablet (160 mg total) by mouth daily. 90 tablet 1   No current facility-administered medications on file prior to visit.    Social History   Tobacco Use   Smoking status: Never   Smokeless tobacco: Never  Substance Use Topics   Alcohol use: Yes    Alcohol/week: 0.0 standard drinks of alcohol    Comment: occasional    Review of Systems  Constitutional:  Negative for chills and fever.  Respiratory:  Positive for cough (occassional).   Cardiovascular:  Negative for chest pain and palpitations.  Gastrointestinal:  Negative for nausea and vomiting.  Skin:  Negative for rash.      Objective:    BP (!) 140/78   Pulse 68   Temp 97.7 F (36.5 C) (Oral)   Wt 175 lb (79.4 kg)   SpO2 99%   BMI 32.01 kg/m  BP Readings from Last 3 Encounters:  03/15/22 (!) 140/78  01/05/22 130/70  09/21/21 140/86     Physical Exam Vitals reviewed.  Constitutional:      Appearance: She is well-developed.  HENT:     Head: Normocephalic and atraumatic.  Right Ear: Hearing, tympanic membrane, ear canal and external ear normal. No decreased hearing noted. No drainage, swelling or tenderness. No middle ear effusion. No foreign body. Tympanic membrane is not erythematous or bulging.     Left Ear: Hearing, tympanic membrane, ear canal and external ear normal. No decreased hearing noted. No drainage, swelling or tenderness.  No middle ear effusion. No foreign body. Tympanic membrane is not erythematous or bulging.     Nose: Nose normal. No rhinorrhea.     Right Sinus: No maxillary sinus tenderness or frontal sinus tenderness.     Left Sinus: No maxillary sinus tenderness or frontal sinus tenderness.     Mouth/Throat:     Pharynx: Uvula midline. No oropharyngeal exudate or posterior oropharyngeal erythema.     Tonsils: No tonsillar abscesses.  Eyes:     Conjunctiva/sclera:  Conjunctivae normal.  Cardiovascular:     Rate and Rhythm: Normal rate and regular rhythm.     Pulses: Normal pulses.     Heart sounds: Normal heart sounds.  Pulmonary:     Effort: Pulmonary effort is normal.     Breath sounds: Normal breath sounds. No wheezing, rhonchi or rales.  Musculoskeletal:       Arms:     Comments: Flank tenderness over ribs, prominent left side.  No bony step off, rash, edema, ecchymosis.  Lymphadenopathy:     Head:     Right side of head: No submental, submandibular, tonsillar, preauricular, posterior auricular or occipital adenopathy.     Left side of head: No submental, submandibular, tonsillar, preauricular, posterior auricular or occipital adenopathy.     Cervical: No cervical adenopathy.  Skin:    General: Skin is warm and dry.  Neurological:     Mental Status: She is alert.  Psychiatric:        Speech: Speech normal.        Behavior: Behavior normal.        Thought Content: Thought content normal.        Assessment & Plan:   Problem List Items Addressed This Visit       Other   Rib pain - Primary    Patient well-appearing without shortness of breath.  We jointly agreed likely musculoskeletal strain from recent URI.  Pending chest x-ray with rib series to exclude fracture and certainly underlying pneumonia.  She will continue ibuprofen.  Encouraged her to use heating pad.      Relevant Orders   DG Chest 2 View   DG Ribs Bilateral       I am having Kristy Vega maintain her multivitamin, hydrocortisone, hydrochlorothiazide, metoprolol succinate, mometasone, Tavaborole, and valsartan.   No orders of the defined types were placed in this encounter.   Return precautions given.   Risks, benefits, and alternatives of the medications and treatment plan prescribed today were discussed, and patient expressed understanding.   Education regarding symptom management and diagnosis given to patient on AVS.  Continue to follow with Einar Pheasant, MD for routine health maintenance.   Kristy Vega and I agreed with plan.   Mable Paris, FNP

## 2022-03-15 NOTE — Patient Instructions (Signed)
As discussed, I think most likely musculoskeletal from coughing you had a couple of weeks ago.  Continue ibuprofen.  Use a heating pad a couple times a day.  Certainly let me know if new symptoms were present or pain were not to gradually resolve.  We will let you know what x-ray shows. Nice seeing you today.

## 2022-03-15 NOTE — Assessment & Plan Note (Signed)
Patient well-appearing without shortness of breath.  We jointly agreed likely musculoskeletal strain from recent URI.  Pending chest x-ray with rib series to exclude fracture and certainly underlying pneumonia.  She will continue ibuprofen.  Encouraged her to use heating pad.

## 2022-03-24 ENCOUNTER — Other Ambulatory Visit: Payer: Self-pay | Admitting: Internal Medicine

## 2022-03-24 DIAGNOSIS — Z1231 Encounter for screening mammogram for malignant neoplasm of breast: Secondary | ICD-10-CM

## 2022-04-10 ENCOUNTER — Other Ambulatory Visit (HOSPITAL_COMMUNITY): Payer: Self-pay

## 2022-04-10 ENCOUNTER — Other Ambulatory Visit: Payer: Self-pay | Admitting: Internal Medicine

## 2022-04-13 ENCOUNTER — Encounter: Payer: Self-pay | Admitting: Internal Medicine

## 2022-04-13 ENCOUNTER — Other Ambulatory Visit: Payer: Self-pay

## 2022-04-13 MED ORDER — HYDROCHLOROTHIAZIDE 12.5 MG PO CAPS
12.5000 mg | ORAL_CAPSULE | Freq: Every day | ORAL | 1 refills | Status: DC
  Filled 2022-04-13: qty 90, 90d supply, fill #0

## 2022-04-14 ENCOUNTER — Encounter (HOSPITAL_COMMUNITY): Payer: Self-pay

## 2022-04-14 ENCOUNTER — Other Ambulatory Visit (HOSPITAL_COMMUNITY): Payer: Self-pay

## 2022-04-15 ENCOUNTER — Other Ambulatory Visit: Payer: 59

## 2022-04-16 ENCOUNTER — Other Ambulatory Visit (HOSPITAL_COMMUNITY): Payer: Self-pay

## 2022-04-19 ENCOUNTER — Other Ambulatory Visit (INDEPENDENT_AMBULATORY_CARE_PROVIDER_SITE_OTHER): Payer: 59

## 2022-04-19 DIAGNOSIS — I10 Essential (primary) hypertension: Secondary | ICD-10-CM

## 2022-04-19 LAB — COMPREHENSIVE METABOLIC PANEL
ALT: 15 U/L (ref 0–35)
AST: 18 U/L (ref 0–37)
Albumin: 3.9 g/dL (ref 3.5–5.2)
Alkaline Phosphatase: 74 U/L (ref 39–117)
BUN: 13 mg/dL (ref 6–23)
CO2: 30 mEq/L (ref 19–32)
Calcium: 9.2 mg/dL (ref 8.4–10.5)
Chloride: 102 mEq/L (ref 96–112)
Creatinine, Ser: 0.71 mg/dL (ref 0.40–1.20)
GFR: 90.7 mL/min (ref >60.00)
Glucose, Bld: 97 mg/dL (ref 70–99)
Potassium: 4.1 mEq/L (ref 3.5–5.1)
Sodium: 140 mEq/L (ref 135–145)
Total Bilirubin: 0.4 mg/dL (ref 0.2–1.2)
Total Protein: 7.4 g/dL (ref 6.0–8.3)

## 2022-04-19 LAB — CBC WITH DIFFERENTIAL/PLATELET
Basophils Absolute: 0 10*3/uL (ref 0.0–0.1)
Basophils Relative: 0.6 % (ref 0.0–3.0)
Eosinophils Absolute: 0.2 10*3/uL (ref 0.0–0.7)
Eosinophils Relative: 2.8 % (ref 0.0–5.0)
HCT: 42.8 % (ref 36.0–46.0)
Hemoglobin: 14.7 g/dL (ref 12.0–15.0)
Lymphocytes Relative: 33.6 % (ref 12.0–46.0)
Lymphs Abs: 2 10*3/uL (ref 0.7–4.0)
MCHC: 34.2 g/dL (ref 30.0–36.0)
MCV: 96.6 fl (ref 78.0–100.0)
Monocytes Absolute: 0.4 10*3/uL (ref 0.1–1.0)
Monocytes Relative: 7.1 % (ref 3.0–12.0)
Neutro Abs: 3.4 10*3/uL (ref 1.4–7.7)
Neutrophils Relative %: 55.9 % (ref 43.0–77.0)
Platelets: 325 10*3/uL (ref 150.0–400.0)
RBC: 4.43 Mil/uL (ref 3.87–5.11)
RDW: 12.6 % (ref 11.5–15.5)
WBC: 6 10*3/uL (ref 4.0–10.5)

## 2022-04-19 LAB — LIPID PANEL
Cholesterol: 178 mg/dL (ref 0–200)
HDL: 61.2 mg/dL (ref >39.00)
LDL Cholesterol: 97 mg/dL (ref 0–99)
NonHDL: 116.44
Total CHOL/HDL Ratio: 3
Triglycerides: 95 mg/dL (ref 0.0–149.0)
VLDL: 19 mg/dL (ref 0.0–40.0)

## 2022-04-19 LAB — TSH: TSH: 2.82 u[IU]/mL (ref 0.35–5.50)

## 2022-04-20 ENCOUNTER — Ambulatory Visit (INDEPENDENT_AMBULATORY_CARE_PROVIDER_SITE_OTHER): Payer: 59 | Admitting: Internal Medicine

## 2022-04-20 ENCOUNTER — Encounter: Payer: Self-pay | Admitting: Internal Medicine

## 2022-04-20 ENCOUNTER — Other Ambulatory Visit: Payer: Self-pay

## 2022-04-20 VITALS — BP 130/70 | HR 75 | Temp 98.1°F | Resp 14 | Ht 62.0 in | Wt 177.8 lb

## 2022-04-20 DIAGNOSIS — R059 Cough, unspecified: Secondary | ICD-10-CM

## 2022-04-20 DIAGNOSIS — Z86010 Personal history of colonic polyps: Secondary | ICD-10-CM | POA: Diagnosis not present

## 2022-04-20 DIAGNOSIS — Z0000 Encounter for general adult medical examination without abnormal findings: Secondary | ICD-10-CM | POA: Diagnosis not present

## 2022-04-20 DIAGNOSIS — I10 Essential (primary) hypertension: Secondary | ICD-10-CM

## 2022-04-20 DIAGNOSIS — R0781 Pleurodynia: Secondary | ICD-10-CM | POA: Diagnosis not present

## 2022-04-20 DIAGNOSIS — Z13220 Encounter for screening for lipoid disorders: Secondary | ICD-10-CM | POA: Diagnosis not present

## 2022-04-20 DIAGNOSIS — F439 Reaction to severe stress, unspecified: Secondary | ICD-10-CM | POA: Diagnosis not present

## 2022-04-20 MED ORDER — VALSARTAN 160 MG PO TABS
160.0000 mg | ORAL_TABLET | Freq: Every day | ORAL | 1 refills | Status: DC
  Filled 2022-04-20 – 2022-07-07 (×3): qty 90, 90d supply, fill #0

## 2022-04-20 MED ORDER — HYDROCHLOROTHIAZIDE 12.5 MG PO CAPS
12.5000 mg | ORAL_CAPSULE | Freq: Every day | ORAL | 1 refills | Status: DC
  Filled 2022-04-20 – 2022-07-07 (×3): qty 90, 90d supply, fill #0

## 2022-04-20 MED ORDER — METOPROLOL SUCCINATE ER 100 MG PO TB24
ORAL_TABLET | ORAL | 1 refills | Status: DC
  Filled 2022-04-20: qty 90, 90d supply, fill #0

## 2022-04-20 NOTE — Assessment & Plan Note (Signed)
Overall improved.  Lungs clear.  Delsym as directed.  Nasacort.  Call with update.

## 2022-04-20 NOTE — Patient Instructions (Signed)
Nasacort nasal spray - 2 sprays each nostril one time per day. Do this in the evening.    Delsym cough syrup - twice a day as needed for cough.

## 2022-04-20 NOTE — Assessment & Plan Note (Addendum)
Physical today 04/20/22.  PAP 09/13/19-  Negative with negative HPV.  Colonoscopy 01/2021.  Recommended CT colonography.  Ordered last visit.  Will follow up on getting this scheduled.  Mammogram scheduled for 04/22/22.  The 10-year ASCVD risk score (Arnett DK, et al., 2019) is: 6.3%   Values used to calculate the score:     Age: 62 years     Sex: Female     Is Non-Hispanic African American: No     Diabetic: No     Tobacco smoker: No     Systolic Blood Pressure: 001 mmHg     Is BP treated: Yes     HDL Cholesterol: 61.2 mg/dL     Total Cholesterol: 178 mg/dL

## 2022-04-20 NOTE — Assessment & Plan Note (Signed)
Previous rib pain has resolved.  Xrays as outlined.  Follow.

## 2022-04-20 NOTE — Assessment & Plan Note (Signed)
Had recent incomplete colonoscopy.  Recommended colonography.  Is agreeable for f/u CT colonography.

## 2022-04-20 NOTE — Assessment & Plan Note (Signed)
Increased stress. Work stress.  Discussed. Follow.

## 2022-04-20 NOTE — Progress Notes (Signed)
Subjective:    Patient ID: Kristy Vega, female    DOB: Jun 13, 1960, 62 y.o.   MRN: [id number]  Patient here for  Chief Complaint  Patient presents with   Annual Exam    CPE    HPI Here for physical exam. Recently evaluated for rib pain.  Previous URI with increased cough.  CXR - lungs clear.  Rib xray - no evidence of displaced rib fracture.  She reports that her symptoms resolved.  She was with alcohol for approximately 10 days.  Middle of last week she started developing some cough again.  No fever.  Minimal drainage.  No sore throat.  No chest pain or tightness.  No shortness of breath.  Cough is intermittent.  Increased stress.  Discussed.  Overall appears to be handling things relatively well.  Discussed previous colonoscopy.  Recommended CT colonography.  Was ordered last visit.  Has not been scheduled.  Losartan was changed to Diovan last visit.  She has been monitoring her blood pressures.  Most recent readings average 108- 127/60s to 70s.   Past Medical History:  Diagnosis Date   Basal cell carcinoma 06/08/2019   Right chin   History of colon polyps    Vaginal delivery    X 2   Past Surgical History:  Procedure Laterality Date   ABDOMINAL HYSTERECTOMY     Dr Josefa Half, for menorrhagia   CHOLECYSTECTOMY     INCISION / DRAINAGE HAND / FINGER     removal of sewing needle   Family History  Problem Relation Age of Onset   Hypertension Mother    Cancer Father        prostate   Stroke Maternal Grandfather    Breast cancer Neg Hx    Social History   Socioeconomic History   Marital status: Married    Spouse name: Not on file   Number of children: Not on file   Years of education: Not on file   Highest education level: Not on file  Occupational History   Not on file  Tobacco Use   Smoking status: Never   Smokeless tobacco: Never  Substance and Sexual Activity   Alcohol use: Yes    Alcohol/week: 0.0 standard drinks of alcohol    Comment: occasional   Drug use:  Not on file   Sexual activity: Not on file  Other Topics Concern   Not on file  Social History Narrative   Exercises regularly      Animals: Dogs and horses   Social Determinants of Health   Financial Resource Strain: Not on file  Food Insecurity: Not on file  Transportation Needs: Not on file  Physical Activity: Not on file  Stress: Not on file  Social Connections: Not on file     Review of Systems  Constitutional:  Negative for appetite change and unexpected weight change.  HENT:  Positive for postnasal drip. Negative for sinus pressure and sore throat.   Eyes:  Negative for pain and visual disturbance.  Respiratory:  Positive for cough. Negative for chest tightness and shortness of breath.   Cardiovascular:  Negative for chest pain, palpitations and leg swelling.  Gastrointestinal:  Negative for abdominal pain, diarrhea, nausea and vomiting.  Genitourinary:  Negative for difficulty urinating and dysuria.  Musculoskeletal:  Negative for joint swelling and myalgias.  Skin:  Negative for color change and rash.  Neurological:  Negative for dizziness and headaches.  Hematological:  Negative for adenopathy. Does not bruise/bleed easily.  Psychiatric/Behavioral:  Negative for agitation and dysphoric mood.        Objective:     BP 130/70 (BP Location: Left Arm, Patient Position: Sitting, Cuff Size: Large)   Pulse 75   Temp 98.1 F (36.7 C) (Temporal)   Resp 14   Ht '5\' 2"'$  (1.575 m)   Wt 177 lb 12.8 oz (80.6 kg)   SpO2 99%   BMI 32.52 kg/m  Wt Readings from Last 3 Encounters:  04/20/22 177 lb 12.8 oz (80.6 kg)  03/15/22 175 lb (79.4 kg)  01/05/22 177 lb 12.8 oz (80.6 kg)    Physical Exam Vitals reviewed.  Constitutional:      General: She is not in acute distress.    Appearance: Normal appearance. She is well-developed.  HENT:     Head: Normocephalic and atraumatic.     Right Ear: External ear normal.     Left Ear: External ear normal.  Eyes:     General: No  scleral icterus.       Right eye: No discharge.        Left eye: No discharge.     Conjunctiva/sclera: Conjunctivae normal.  Neck:     Thyroid: No thyromegaly.  Cardiovascular:     Rate and Rhythm: Normal rate and regular rhythm.  Pulmonary:     Effort: No tachypnea, accessory muscle usage or respiratory distress.     Breath sounds: Normal breath sounds. No decreased breath sounds or wheezing.  Chest:  Breasts:    Right: No inverted nipple, mass, nipple discharge or tenderness (no axillary adenopathy).     Left: No inverted nipple, mass, nipple discharge or tenderness (no axilarry adenopathy).  Abdominal:     General: Bowel sounds are normal.     Palpations: Abdomen is soft.     Tenderness: There is no abdominal tenderness.  Musculoskeletal:        General: No swelling or tenderness.     Cervical back: Neck supple.  Lymphadenopathy:     Cervical: No cervical adenopathy.  Skin:    Findings: No erythema or rash.  Neurological:     Mental Status: She is alert and oriented to person, place, and time.  Psychiatric:        Mood and Affect: Mood normal.        Behavior: Behavior normal.     Outpatient Encounter Medications as of 04/20/2022  Medication Sig   hydrocortisone (ANUSOL-HC) 25 MG suppository Place 1 suppository (25 mg total) rectally 2 (two) times daily.   mometasone (ELOCON) 0.1 % cream Apply twice daily to affected areas up to 2 weeks as needed for rash/itching   Multiple Vitamin (MULTIVITAMIN) tablet Take 1 tablet by mouth daily.   Tavaborole (KERYDIN) 5 % SOLN Apply to affected toenails at bedtime   [DISCONTINUED] hydrochlorothiazide (MICROZIDE) 12.5 MG capsule Take 1 capsule (12.5 mg total) by mouth daily with losartan.   [DISCONTINUED] metoprolol succinate (TOPROL-XL) 100 MG 24 hr tablet TAKE 1 TABLET BY MOUTH ONCE A DAY WITH OR IMMEDIATELY FOLLOWING A MEAL   [DISCONTINUED] valsartan (DIOVAN) 160 MG tablet Take 1 tablet (160 mg total) by mouth daily.    hydrochlorothiazide (MICROZIDE) 12.5 MG capsule Take 1 capsule (12.5 mg total) by mouth daily with losartan.   metoprolol succinate (TOPROL-XL) 100 MG 24 hr tablet TAKE 1 TABLET BY MOUTH ONCE A DAY WITH OR IMMEDIATELY FOLLOWING A MEAL   valsartan (DIOVAN) 160 MG tablet Take 1 tablet (160 mg total) by mouth daily.   No  facility-administered encounter medications on file as of 04/20/2022.     Lab Results  Component Value Date   WBC 6.0 04/19/2022   HGB 14.7 04/19/2022   HCT 42.8 04/19/2022   PLT 325.0 04/19/2022   GLUCOSE 97 04/19/2022   CHOL 178 04/19/2022   TRIG 95.0 04/19/2022   HDL 61.20 04/19/2022   LDLDIRECT 122.8 07/24/2012   LDLCALC 97 04/19/2022   ALT 15 04/19/2022   AST 18 04/19/2022   NA 140 04/19/2022   K 4.1 04/19/2022   CL 102 04/19/2022   CREATININE 0.71 04/19/2022   BUN 13 04/19/2022   CO2 30 04/19/2022   TSH 2.82 04/19/2022   INR 1.0 11/22/2016   HGBA1C 5.5 02/16/2016   MICROALBUR <0.7 09/02/2014    MM 3D SCREEN BREAST BILATERAL  Result Date: 04/09/2021 CLINICAL DATA:  Screening. EXAM: DIGITAL SCREENING BILATERAL MAMMOGRAM WITH TOMOSYNTHESIS AND CAD TECHNIQUE: Bilateral screening digital craniocaudal and mediolateral oblique mammograms were obtained. Bilateral screening digital breast tomosynthesis was performed. The images were evaluated with computer-aided detection. COMPARISON:  Previous exam(s). ACR Breast Density Category b: There are scattered areas of fibroglandular density. FINDINGS: There are no findings suspicious for malignancy. IMPRESSION: No mammographic evidence of malignancy. A result letter of this screening mammogram will be mailed directly to the patient. RECOMMENDATION: Screening mammogram in one year. (Code:SM-B-01Y) BI-RADS CATEGORY  1: Negative. Electronically Signed   By: Ammie Ferrier M.D.   On: 04/09/2021 11:03      Assessment & Plan:  Routine general medical examination at a health care facility  Health care maintenance Assessment  & Plan: Physical today 04/20/22.  PAP 09/13/19-  Negative with negative HPV.  Colonoscopy 01/2021.  Recommended CT colonography.  Ordered last visit.  Will follow up on getting this scheduled.  Mammogram scheduled for 04/22/22.  The 10-year ASCVD risk score (Arnett DK, et al., 2019) is: 6.3%   Values used to calculate the score:     Age: 54 years     Sex: Female     Is Non-Hispanic African American: No     Diabetic: No     Tobacco smoker: No     Systolic Blood Pressure: 258 mmHg     Is BP treated: Yes     HDL Cholesterol: 61.2 mg/dL     Total Cholesterol: 178 mg/dL    Essential hypertension, benign Assessment & Plan: Blood pressure as outlined.  On diovan, hctz and  Metoprolol.   Losartan change to diovan last visit.   Blood pressure as outlined.  Given recent readings improved, will hold on changing medication.  Follow pressures.  Send in readings.  Follow metabolic panel.   Orders: -     Basic metabolic panel; Future  Screening cholesterol level -     Lipid panel; Future -     Hepatic function panel; Future  History of colonic polyps Assessment & Plan: Had recent incomplete colonoscopy.  Recommended colonography.  Is agreeable for f/u CT colonography.    Rib pain Assessment & Plan: Previous rib pain has resolved.  Xrays as outlined.  Follow.    Stress Assessment & Plan: Increased stress. Work stress.  Discussed. Follow.    Cough, unspecified type Assessment & Plan: Overall improved.  Lungs clear.  Delsym as directed.  Nasacort.  Call with update.    Other orders -     hydroCHLOROthiazide; Take 1 capsule (12.5 mg total) by mouth daily with losartan.  Dispense: 90 capsule; Refill: 1 -     Metoprolol Succinate  ER; TAKE 1 TABLET BY MOUTH ONCE A DAY WITH OR IMMEDIATELY FOLLOWING A MEAL  Dispense: 90 tablet; Refill: 1 -     Valsartan; Take 1 tablet (160 mg total) by mouth daily.  Dispense: 90 tablet; Refill: 1     Einar Pheasant, MD

## 2022-04-20 NOTE — Assessment & Plan Note (Addendum)
Blood pressure as outlined.  On diovan, hctz and  Metoprolol.   Losartan change to diovan last visit.   Blood pressure as outlined.  Given recent readings improved, will hold on changing medication.  Follow pressures.  Send in readings.  Follow metabolic panel.

## 2022-04-22 ENCOUNTER — Ambulatory Visit
Admission: RE | Admit: 2022-04-22 | Discharge: 2022-04-22 | Disposition: A | Discharge status: Home / Self Care | Payer: 59 | Source: Ambulatory Visit | Attending: Internal Medicine | Admitting: Internal Medicine

## 2022-04-22 DIAGNOSIS — Z1231 Encounter for screening mammogram for malignant neoplasm of breast: Secondary | ICD-10-CM | POA: Insufficient documentation

## 2022-07-04 ENCOUNTER — Other Ambulatory Visit: Payer: Self-pay

## 2022-07-07 ENCOUNTER — Other Ambulatory Visit (HOSPITAL_COMMUNITY): Payer: Self-pay

## 2022-07-07 ENCOUNTER — Other Ambulatory Visit: Payer: Self-pay

## 2022-07-15 ENCOUNTER — Other Ambulatory Visit (INDEPENDENT_AMBULATORY_CARE_PROVIDER_SITE_OTHER): Payer: 59

## 2022-07-15 DIAGNOSIS — Z13220 Encounter for screening for lipoid disorders: Secondary | ICD-10-CM | POA: Diagnosis not present

## 2022-07-15 DIAGNOSIS — I10 Essential (primary) hypertension: Secondary | ICD-10-CM | POA: Diagnosis not present

## 2022-07-15 LAB — HEPATIC FUNCTION PANEL
ALT: 13 U/L (ref 0–35)
AST: 16 U/L (ref 0–37)
Albumin: 3.9 g/dL (ref 3.5–5.2)
Alkaline Phosphatase: 64 U/L (ref 39–117)
Bilirubin, Direct: 0.1 mg/dL (ref 0.0–0.3)
Total Bilirubin: 0.6 mg/dL (ref 0.2–1.2)
Total Protein: 6.5 g/dL (ref 6.0–8.3)

## 2022-07-15 LAB — BASIC METABOLIC PANEL
BUN: 18 mg/dL (ref 6–23)
CO2: 29 mEq/L (ref 19–32)
Calcium: 8.7 mg/dL (ref 8.4–10.5)
Chloride: 105 mEq/L (ref 96–112)
Creatinine, Ser: 0.59 mg/dL (ref 0.40–1.20)
GFR: 95.98 mL/min (ref >60.00)
Glucose, Bld: 96 mg/dL (ref 70–99)
Potassium: 4.1 mEq/L (ref 3.5–5.1)
Sodium: 140 mEq/L (ref 135–145)

## 2022-07-15 LAB — LIPID PANEL
Cholesterol: 191 mg/dL (ref 0–200)
HDL: 59 mg/dL (ref >39.00)
LDL Cholesterol: 105 mg/dL — ABNORMAL HIGH (ref 0–99)
NonHDL: 132.34
Total CHOL/HDL Ratio: 3
Triglycerides: 136 mg/dL (ref 0.0–149.0)
VLDL: 27.2 mg/dL (ref 0.0–40.0)

## 2022-07-20 ENCOUNTER — Ambulatory Visit (INDEPENDENT_AMBULATORY_CARE_PROVIDER_SITE_OTHER): Payer: 59 | Admitting: Internal Medicine

## 2022-07-20 ENCOUNTER — Encounter: Payer: Self-pay | Admitting: Internal Medicine

## 2022-07-20 ENCOUNTER — Other Ambulatory Visit: Payer: Self-pay

## 2022-07-20 ENCOUNTER — Other Ambulatory Visit (HOSPITAL_COMMUNITY): Payer: Self-pay

## 2022-07-20 VITALS — BP 117/77 | HR 75 | Temp 97.8°F | Resp 16 | Ht 62.0 in | Wt 174.0 lb

## 2022-07-20 DIAGNOSIS — Z13220 Encounter for screening for lipoid disorders: Secondary | ICD-10-CM

## 2022-07-20 DIAGNOSIS — Z86010 Personal history of colonic polyps: Secondary | ICD-10-CM | POA: Diagnosis not present

## 2022-07-20 DIAGNOSIS — I10 Essential (primary) hypertension: Secondary | ICD-10-CM | POA: Diagnosis not present

## 2022-07-20 DIAGNOSIS — F439 Reaction to severe stress, unspecified: Secondary | ICD-10-CM

## 2022-07-20 MED ORDER — METOPROLOL SUCCINATE ER 100 MG PO TB24
100.0000 mg | ORAL_TABLET | Freq: Every day | ORAL | 1 refills | Status: DC
  Filled 2022-07-20: qty 90, 90d supply, fill #0
  Filled 2022-11-06: qty 90, 90d supply, fill #1

## 2022-07-20 MED ORDER — VALSARTAN 160 MG PO TABS
160.0000 mg | ORAL_TABLET | Freq: Every day | ORAL | 1 refills | Status: DC
  Filled 2022-07-20 – 2022-10-09 (×2): qty 90, 90d supply, fill #0

## 2022-07-20 MED ORDER — HYDROCHLOROTHIAZIDE 12.5 MG PO CAPS
12.5000 mg | ORAL_CAPSULE | Freq: Every day | ORAL | 1 refills | Status: DC
  Filled 2022-07-20 – 2022-10-09 (×2): qty 90, 90d supply, fill #0

## 2022-07-20 NOTE — Progress Notes (Signed)
Subjective:    Patient ID: Kristy CampbellLisa G Lamba, female    DOB: 06/17/1960, 62 y.o.   MRN: [id number]  Patient here for  Chief Complaint  Patient presents with   Medical Management of Chronic Issues    HPI Here to follow up regarding hypertension and increased stress.  On diovan, metoprolol and hctz. Reviewed outside blood pressure readings.  Overall ranges 117-130/70s.  No chest pain or sob reported.  No abdominal pain or bowel change reported.  Handling stress.  Discussed.    Past Medical History:  Diagnosis Date   Basal cell carcinoma 06/08/2019   Right chin   History of colon polyps    Vaginal delivery    X 2   Past Surgical History:  Procedure Laterality Date   ABDOMINAL HYSTERECTOMY     Dr Conley SimmondsBrook Silva, for menorrhagia   CHOLECYSTECTOMY     INCISION / DRAINAGE HAND / FINGER     removal of sewing needle   Family History  Problem Relation Age of Onset   Hypertension Mother    Cancer Father        prostate   Stroke Maternal Grandfather    Breast cancer Neg Hx    Social History   Socioeconomic History   Marital status: Married    Spouse name: Not on file   Number of children: Not on file   Years of education: Not on file   Highest education level: Not on file  Occupational History   Not on file  Tobacco Use   Smoking status: Never   Smokeless tobacco: Never  Substance and Sexual Activity   Alcohol use: Yes    Alcohol/week: 0.0 standard drinks of alcohol    Comment: occasional   Drug use: Not on file   Sexual activity: Not on file  Other Topics Concern   Not on file  Social History Narrative   Exercises regularly      Animals: Dogs and horses   Social Determinants of Health   Financial Resource Strain: Not on file  Food Insecurity: Not on file  Transportation Needs: Not on file  Physical Activity: Not on file  Stress: Not on file  Social Connections: Not on file     Review of Systems  Constitutional:  Negative for appetite change and unexpected  weight change.  HENT:  Negative for congestion and sinus pressure.   Respiratory:  Negative for cough, chest tightness and shortness of breath.   Cardiovascular:  Negative for chest pain and palpitations.  Gastrointestinal:  Negative for abdominal pain, diarrhea, nausea and vomiting.  Genitourinary:  Negative for difficulty urinating and dysuria.  Musculoskeletal:  Negative for joint swelling and myalgias.  Skin:  Negative for color change and rash.  Neurological:  Negative for dizziness and headaches.  Psychiatric/Behavioral:  Negative for agitation and dysphoric mood.        Objective:     BP 117/77   Pulse 75   Temp 97.8 F (36.6 C)   Resp 16   Ht 5\' 2"  (1.575 m)   Wt 174 lb (78.9 kg)   SpO2 97%   BMI 31.83 kg/m  Wt Readings from Last 3 Encounters:  07/20/22 174 lb (78.9 kg)  04/20/22 177 lb 12.8 oz (80.6 kg)  03/15/22 175 lb (79.4 kg)    Physical Exam Vitals reviewed.  Constitutional:      General: She is not in acute distress.    Appearance: Normal appearance.  HENT:     Head: Normocephalic and  atraumatic.     Right Ear: External ear normal.     Left Ear: External ear normal.  Eyes:     General: No scleral icterus.       Right eye: No discharge.        Left eye: No discharge.     Conjunctiva/sclera: Conjunctivae normal.  Neck:     Thyroid: No thyromegaly.  Cardiovascular:     Rate and Rhythm: Normal rate and regular rhythm.  Pulmonary:     Effort: No respiratory distress.     Breath sounds: Normal breath sounds. No wheezing.  Abdominal:     General: Bowel sounds are normal.     Palpations: Abdomen is soft.     Tenderness: There is no abdominal tenderness.  Musculoskeletal:        General: No swelling or tenderness.     Cervical back: Neck supple. No tenderness.  Lymphadenopathy:     Cervical: No cervical adenopathy.  Skin:    Findings: No erythema or rash.  Neurological:     Mental Status: She is alert.  Psychiatric:        Mood and Affect: Mood  normal.        Behavior: Behavior normal.      Outpatient Encounter Medications as of 07/20/2022  Medication Sig   hydrochlorothiazide (MICROZIDE) 12.5 MG capsule Take 1 capsule (12.5 mg total) by mouth daily.   metoprolol succinate (TOPROL-XL) 100 MG 24 hr tablet Take 1 tablet (100 mg total) by mouth daily. With or immediately following a meal   mometasone (ELOCON) 0.1 % cream Apply twice daily to affected areas up to 2 weeks as needed for rash/itching   Multiple Vitamin (MULTIVITAMIN) tablet Take 1 tablet by mouth daily.   valsartan (DIOVAN) 160 MG tablet Take 1 tablet (160 mg total) by mouth daily.   [DISCONTINUED] hydrochlorothiazide (MICROZIDE) 12.5 MG capsule Take 1 capsule (12.5 mg total) by mouth daily with losartan.   [DISCONTINUED] hydrocortisone (ANUSOL-HC) 25 MG suppository Place 1 suppository (25 mg total) rectally 2 (two) times daily.   [DISCONTINUED] metoprolol succinate (TOPROL-XL) 100 MG 24 hr tablet TAKE 1 TABLET BY MOUTH ONCE A DAY WITH OR IMMEDIATELY FOLLOWING A MEAL   [DISCONTINUED] Tavaborole (KERYDIN) 5 % SOLN Apply to affected toenails at bedtime   [DISCONTINUED] valsartan (DIOVAN) 160 MG tablet Take 1 tablet (160 mg total) by mouth daily.   No facility-administered encounter medications on file as of 07/20/2022.     Lab Results  Component Value Date   WBC 6.0 04/19/2022   HGB 14.7 04/19/2022   HCT 42.8 04/19/2022   PLT 325.0 04/19/2022   GLUCOSE 96 07/15/2022   CHOL 191 07/15/2022   TRIG 136.0 07/15/2022   HDL 59.00 07/15/2022   LDLDIRECT 122.8 07/24/2012   LDLCALC 105 (H) 07/15/2022   ALT 13 07/15/2022   AST 16 07/15/2022   NA 140 07/15/2022   K 4.1 07/15/2022   CL 105 07/15/2022   CREATININE 0.59 07/15/2022   BUN 18 07/15/2022   CO2 29 07/15/2022   TSH 2.82 04/19/2022   INR 1.0 11/22/2016   HGBA1C 5.5 02/16/2016   MICROALBUR <0.7 09/02/2014    MM 3D SCREEN BREAST BILATERAL  Result Date: 04/23/2022 CLINICAL DATA:  Screening. EXAM: DIGITAL  SCREENING BILATERAL MAMMOGRAM WITH TOMOSYNTHESIS AND CAD TECHNIQUE: Bilateral screening digital craniocaudal and mediolateral oblique mammograms were obtained. Bilateral screening digital breast tomosynthesis was performed. The images were evaluated with computer-aided detection. COMPARISON:  Previous exam(s). ACR Breast Density Category c:  The breast tissue is heterogeneously dense, which may obscure small masses. FINDINGS: There are no findings suspicious for malignancy. IMPRESSION: No mammographic evidence of malignancy. A result letter of this screening mammogram will be mailed directly to the patient. RECOMMENDATION: Screening mammogram in one year. (Code:SM-B-01Y) BI-RADS CATEGORY  1: Negative. Electronically Signed   By: Frederico Hamman M.D.   On: 04/23/2022 09:46       Assessment & Plan:  Essential hypertension, benign Assessment & Plan: The 10-year ASCVD risk score (Arnett DK, et al., 2019) is: 5.8%   Values used to calculate the score:     Age: 38 years     Sex: Female     Is Non-Hispanic African American: No     Diabetic: No     Tobacco smoker: No     Systolic Blood Pressure: 130 mmHg     Is BP treated: Yes     HDL Cholesterol: 59 mg/dL     Total Cholesterol: 191 mg/dL  Blood pressures reviewed as outlined.  Continue current medication regimen.  Follow pressures.  Follow metabolic panel.    Screening cholesterol level -     Lipid panel; Future -     Hepatic function panel; Future -     Basic metabolic panel; Future  History of colonic polyps Assessment & Plan: Had recent incomplete colonoscopy.  Recommended colonography.  Is agreeable for f/u CT colonography. Have ordered.  Need to schedule.    Stress Assessment & Plan: Increased stress. Work stress.  Discussed. Does not feel needs any further intervention. Follow.    Other orders -     hydroCHLOROthiazide; Take 1 capsule (12.5 mg total) by mouth daily.  Dispense: 90 capsule; Refill: 1 -     Metoprolol Succinate ER;  Take 1 tablet (100 mg total) by mouth daily. With or immediately following a meal  Dispense: 90 tablet; Refill: 1 -     Valsartan; Take 1 tablet (160 mg total) by mouth daily.  Dispense: 90 tablet; Refill: 1     Dale Willowbrook, MD

## 2022-07-20 NOTE — Assessment & Plan Note (Addendum)
The 10-year ASCVD risk score (Arnett DK, et al., 2019) is: 5.8%   Values used to calculate the score:     Age: 62 years     Sex: Female     Is Non-Hispanic African American: No     Diabetic: No     Tobacco smoker: No     Systolic Blood Pressure: 130 mmHg     Is BP treated: Yes     HDL Cholesterol: 59 mg/dL     Total Cholesterol: 191 mg/dL  Blood pressures reviewed as outlined.  Continue current medication regimen.  Follow pressures.  Follow metabolic panel.

## 2022-07-25 ENCOUNTER — Telehealth: Payer: Self-pay | Admitting: Internal Medicine

## 2022-07-25 ENCOUNTER — Encounter: Payer: Self-pay | Admitting: Internal Medicine

## 2022-07-25 NOTE — Assessment & Plan Note (Signed)
Had recent incomplete colonoscopy.  Recommended colonography.  Is agreeable for f/u CT colonography. Have ordered.  Need to schedule.

## 2022-07-25 NOTE — Assessment & Plan Note (Signed)
Increased stress.  Work stress.  Discussed.  Does not feel needs any further intervention. Follow.  

## 2022-07-25 NOTE — Telephone Encounter (Signed)
CT colonography has been ordered.  Needs to be scheduled.  Can you help with this.  Thanks.

## 2022-07-28 NOTE — Telephone Encounter (Signed)
LMTCB

## 2022-07-28 NOTE — Telephone Encounter (Signed)
I had spoke to pt regarding her previous colonoscopy and Gi's recommendation for CT colonography.  She had said she was agreeable now.  Please confirm and notify her that I have contacted GI about getting this scheduled.

## 2022-07-29 NOTE — Telephone Encounter (Signed)
F/u   Rtn call back from yesterday.

## 2022-07-29 NOTE — Telephone Encounter (Signed)
Pt mychart response:  yes I believe she tried getting this scheduled several times. At this point, I feel we need to wait until the next scheduled one. I'm not wild about the prep and wonder if in the future whomever I can be scheduled with would be able to do ultrasound same day of procedure, if they have issues? This was my first colonscopy since Dr. Ricki Rodriguez retired and never have problems with him.

## 2022-07-29 NOTE — Telephone Encounter (Signed)
Wants to hold on scheduling.

## 2022-09-14 ENCOUNTER — Other Ambulatory Visit: Payer: Self-pay | Admitting: Gastroenterology

## 2022-09-14 DIAGNOSIS — Z86010 Personal history of colonic polyps: Secondary | ICD-10-CM

## 2022-09-20 ENCOUNTER — Encounter: Payer: Self-pay | Admitting: Internal Medicine

## 2022-10-09 ENCOUNTER — Other Ambulatory Visit (HOSPITAL_COMMUNITY): Payer: Self-pay

## 2022-10-11 ENCOUNTER — Other Ambulatory Visit: Payer: Self-pay

## 2022-11-06 ENCOUNTER — Other Ambulatory Visit (HOSPITAL_COMMUNITY): Payer: Self-pay

## 2022-11-10 ENCOUNTER — Encounter (INDEPENDENT_AMBULATORY_CARE_PROVIDER_SITE_OTHER): Payer: Self-pay

## 2022-11-18 ENCOUNTER — Other Ambulatory Visit (INDEPENDENT_AMBULATORY_CARE_PROVIDER_SITE_OTHER): Payer: 59

## 2022-11-18 DIAGNOSIS — Z13220 Encounter for screening for lipoid disorders: Secondary | ICD-10-CM

## 2022-11-18 LAB — BASIC METABOLIC PANEL
BUN: 16 mg/dL (ref 6–23)
CO2: 28 mEq/L (ref 19–32)
Calcium: 9.1 mg/dL (ref 8.4–10.5)
Chloride: 103 mEq/L (ref 96–112)
Creatinine, Ser: 0.63 mg/dL (ref 0.40–1.20)
GFR: 94.24 mL/min (ref >60.00)
Glucose, Bld: 100 mg/dL — ABNORMAL HIGH (ref 70–99)
Potassium: 4.1 mEq/L (ref 3.5–5.1)
Sodium: 139 mEq/L (ref 135–145)

## 2022-11-18 LAB — LIPID PANEL
Cholesterol: 189 mg/dL (ref 0–200)
HDL: 59.8 mg/dL (ref >39.00)
LDL Cholesterol: 104 mg/dL — ABNORMAL HIGH (ref 0–99)
NonHDL: 128.99
Total CHOL/HDL Ratio: 3
Triglycerides: 123 mg/dL (ref 0.0–149.0)
VLDL: 24.6 mg/dL (ref 0.0–40.0)

## 2022-11-18 LAB — HEPATIC FUNCTION PANEL
ALT: 15 U/L (ref 0–35)
AST: 17 U/L (ref 0–37)
Albumin: 4 g/dL (ref 3.5–5.2)
Alkaline Phosphatase: 60 U/L (ref 39–117)
Bilirubin, Direct: 0.1 mg/dL (ref 0.0–0.3)
Total Bilirubin: 0.5 mg/dL (ref 0.2–1.2)
Total Protein: 7.1 g/dL (ref 6.0–8.3)

## 2022-11-24 ENCOUNTER — Other Ambulatory Visit (HOSPITAL_COMMUNITY): Payer: Self-pay

## 2022-11-24 ENCOUNTER — Ambulatory Visit (INDEPENDENT_AMBULATORY_CARE_PROVIDER_SITE_OTHER): Payer: 59 | Admitting: Internal Medicine

## 2022-11-24 VITALS — BP 136/80 | HR 72 | Temp 97.9°F | Resp 16 | Ht 62.0 in | Wt 180.0 lb

## 2022-11-24 DIAGNOSIS — Z13220 Encounter for screening for lipoid disorders: Secondary | ICD-10-CM

## 2022-11-24 DIAGNOSIS — F439 Reaction to severe stress, unspecified: Secondary | ICD-10-CM | POA: Diagnosis not present

## 2022-11-24 DIAGNOSIS — M1712 Unilateral primary osteoarthritis, left knee: Secondary | ICD-10-CM

## 2022-11-24 DIAGNOSIS — Z86010 Personal history of colonic polyps: Secondary | ICD-10-CM

## 2022-11-24 DIAGNOSIS — I10 Essential (primary) hypertension: Secondary | ICD-10-CM | POA: Diagnosis not present

## 2022-11-24 MED ORDER — HYDROCHLOROTHIAZIDE 12.5 MG PO CAPS
12.5000 mg | ORAL_CAPSULE | Freq: Every day | ORAL | 1 refills | Status: DC
  Filled 2022-11-24 – 2023-01-07 (×2): qty 90, 90d supply, fill #0
  Filled 2023-04-04: qty 90, 90d supply, fill #1

## 2022-11-24 MED ORDER — VALSARTAN 160 MG PO TABS
160.0000 mg | ORAL_TABLET | Freq: Every day | ORAL | 1 refills | Status: DC
  Filled 2022-11-24 – 2023-01-07 (×2): qty 90, 90d supply, fill #0
  Filled 2023-04-04: qty 90, 90d supply, fill #1

## 2022-11-24 MED ORDER — METOPROLOL SUCCINATE ER 100 MG PO TB24
100.0000 mg | ORAL_TABLET | Freq: Every day | ORAL | 1 refills | Status: DC
  Filled 2022-11-24 – 2023-02-04 (×2): qty 90, 90d supply, fill #0

## 2022-11-24 NOTE — Assessment & Plan Note (Addendum)
The 10-year ASCVD risk score (Arnett DK, et al., 2019) is: 6.3%   Values used to calculate the score:     Age: 62 years     Sex: Female     Is Non-Hispanic African American: No     Diabetic: No     Tobacco smoker: No     Systolic Blood Pressure: 136 mmHg     Is BP treated: Yes     HDL Cholesterol: 59.8 mg/dL     Total Cholesterol: 189 mg/dL  The 09-WJXB ASCVD risk score (Arnett DK, et al., 2019) is: 6.3%   Values used to calculate the score:     Age: 25 years     Sex: Female     Is Non-Hispanic African American: No     Diabetic: No     Tobacco smoker: No     Systolic Blood Pressure: 136 mmHg     Is BP treated: Yes     HDL Cholesterol: 59.8 mg/dL     Total Cholesterol: 189 mg/dL  Blood pressures reviewed as outlined.  Continue current medication regimen.  Follow pressures.  Follow metabolic panel. Continue diet and exercise.

## 2022-11-24 NOTE — Progress Notes (Signed)
Subjective:    Patient ID: Kristy Vega, female    DOB: 1960/11/27, 62 y.o.   MRN: [id number]  Patient here for  Chief Complaint  Patient presents with   Medical Management of Chronic Issues    HPI Here to follow up regarding hypertension and increased stress. On diovan, metoprolol and hctz. Blood pressure as outlined.  No chest pain or sob reported.  No cough or congestion.  No abdominal pain or bowel change noted.  Had seen GI. Colonoscopy 10/20222 had recommended CT colonography - recommended virtual colonoscopy screening.  Discussed today.  After discussion, agreeable for referral back to GI to discuss further w/up and evaluation.  Does report bilateral knee issues/pain.    Past Medical History:  Diagnosis Date   Basal cell carcinoma 06/08/2019   Right chin   History of colon polyps    Vaginal delivery    X 2   Past Surgical History:  Procedure Laterality Date   ABDOMINAL HYSTERECTOMY     Dr Conley Simmonds, for menorrhagia   CHOLECYSTECTOMY     INCISION / DRAINAGE HAND / FINGER     removal of sewing needle   Family History  Problem Relation Age of Onset   Hypertension Mother    Cancer Father        prostate   Stroke Maternal Grandfather    Breast cancer Neg Hx    Social History   Socioeconomic History   Marital status: Married    Spouse name: Not on file   Number of children: Not on file   Years of education: Not on file   Highest education level: Not on file  Occupational History   Not on file  Tobacco Use   Smoking status: Never   Smokeless tobacco: Never  Substance and Sexual Activity   Alcohol use: Yes    Alcohol/week: 0.0 standard drinks of alcohol    Comment: occasional   Drug use: Not on file   Sexual activity: Not on file  Other Topics Concern   Not on file  Social History Narrative   Exercises regularly      Animals: Dogs and horses   Social Determinants of Health   Financial Resource Strain: Not on file  Food Insecurity: Not on file   Transportation Needs: Not on file  Physical Activity: Not on file  Stress: Not on file  Social Connections: Not on file     Review of Systems  Constitutional:  Negative for appetite change and unexpected weight change.  HENT:  Negative for congestion and sinus pressure.   Respiratory:  Negative for cough, chest tightness and shortness of breath.   Cardiovascular:  Negative for chest pain, palpitations and leg swelling.  Gastrointestinal:  Negative for abdominal pain, diarrhea, nausea and vomiting.  Genitourinary:  Negative for difficulty urinating and dysuria.  Musculoskeletal:  Negative for myalgias.       Knee pain as outlined.   Skin:  Negative for color change and rash.  Neurological:  Negative for dizziness and headaches.  Psychiatric/Behavioral:  Negative for agitation and dysphoric mood.        Objective:     BP 136/80   Pulse 72   Temp 97.9 F (36.6 C)   Resp 16   Ht 5\' 2"  (1.575 m)   Wt 180 lb (81.6 kg)   SpO2 98%   BMI 32.92 kg/m  Wt Readings from Last 3 Encounters:  11/24/22 180 lb (81.6 kg)  07/20/22 174 lb (78.9 kg)  04/20/22 177 lb 12.8 oz (80.6 kg)    Physical Exam Vitals reviewed.  Constitutional:      General: She is not in acute distress.    Appearance: Normal appearance.  HENT:     Head: Normocephalic and atraumatic.     Right Ear: External ear normal.     Left Ear: External ear normal.  Eyes:     General: No scleral icterus.       Right eye: No discharge.        Left eye: No discharge.     Conjunctiva/sclera: Conjunctivae normal.  Neck:     Thyroid: No thyromegaly.  Cardiovascular:     Rate and Rhythm: Normal rate and regular rhythm.  Pulmonary:     Effort: No respiratory distress.     Breath sounds: Normal breath sounds. No wheezing.  Abdominal:     General: Bowel sounds are normal.     Palpations: Abdomen is soft.     Tenderness: There is no abdominal tenderness.  Musculoskeletal:        General: No swelling or tenderness.      Cervical back: Neck supple. No tenderness.  Lymphadenopathy:     Cervical: No cervical adenopathy.  Skin:    Findings: No erythema or rash.  Neurological:     Mental Status: She is alert.  Psychiatric:        Mood and Affect: Mood normal.        Behavior: Behavior normal.      Outpatient Encounter Medications as of 11/24/2022  Medication Sig   hydrochlorothiazide (MICROZIDE) 12.5 MG capsule Take 1 capsule (12.5 mg total) by mouth daily.   metoprolol succinate (TOPROL-XL) 100 MG 24 hr tablet Take 1 tablet (100 mg total) by mouth daily with or immediately following a meal   mometasone (ELOCON) 0.1 % cream Apply twice daily to affected areas up to 2 weeks as needed for rash/itching   Multiple Vitamin (MULTIVITAMIN) tablet Take 1 tablet by mouth daily.   valsartan (DIOVAN) 160 MG tablet Take 1 tablet (160 mg total) by mouth daily.   [DISCONTINUED] hydrochlorothiazide (MICROZIDE) 12.5 MG capsule Take 1 capsule (12.5 mg total) by mouth daily.   [DISCONTINUED] metoprolol succinate (TOPROL-XL) 100 MG 24 hr tablet Take 1 tablet (100 mg total) by mouth daily. With or immediately following a meal   [DISCONTINUED] valsartan (DIOVAN) 160 MG tablet Take 1 tablet (160 mg total) by mouth daily.   No facility-administered encounter medications on file as of 11/24/2022.     Lab Results  Component Value Date   WBC 6.0 04/19/2022   HGB 14.7 04/19/2022   HCT 42.8 04/19/2022   PLT 325.0 04/19/2022   GLUCOSE 100 (H) 11/18/2022   CHOL 189 11/18/2022   TRIG 123.0 11/18/2022   HDL 59.80 11/18/2022   LDLDIRECT 122.8 07/24/2012   LDLCALC 104 (H) 11/18/2022   ALT 15 11/18/2022   AST 17 11/18/2022   NA 139 11/18/2022   K 4.1 11/18/2022   CL 103 11/18/2022   CREATININE 0.63 11/18/2022   BUN 16 11/18/2022   CO2 28 11/18/2022   TSH 2.82 04/19/2022   INR 1.0 11/22/2016   HGBA1C 5.5 02/16/2016   MICROALBUR <0.7 09/02/2014    MM 3D SCREEN BREAST BILATERAL  Result Date: 04/23/2022 CLINICAL DATA:   Screening. EXAM: DIGITAL SCREENING BILATERAL MAMMOGRAM WITH TOMOSYNTHESIS AND CAD TECHNIQUE: Bilateral screening digital craniocaudal and mediolateral oblique mammograms were obtained. Bilateral screening digital breast tomosynthesis was performed. The images were evaluated with computer-aided  detection. COMPARISON:  Previous exam(s). ACR Breast Density Category c: The breast tissue is heterogeneously dense, which may obscure small masses. FINDINGS: There are no findings suspicious for malignancy. IMPRESSION: No mammographic evidence of malignancy. A result letter of this screening mammogram will be mailed directly to the patient. RECOMMENDATION: Screening mammogram in one year. (Code:SM-B-01Y) BI-RADS CATEGORY  1: Negative. Electronically Signed   By: Frederico Hamman M.D.   On: 04/23/2022 09:46       Assessment & Plan:  Screening cholesterol level -     Lipid panel; Future  Essential hypertension, benign Assessment & Plan: The 10-year ASCVD risk score (Arnett DK, et al., 2019) is: 6.3%   Values used to calculate the score:     Age: 69 years     Sex: Female     Is Non-Hispanic African American: No     Diabetic: No     Tobacco smoker: No     Systolic Blood Pressure: 136 mmHg     Is BP treated: Yes     HDL Cholesterol: 59.8 mg/dL     Total Cholesterol: 189 mg/dL  The 16-XWRU ASCVD risk score (Arnett DK, et al., 2019) is: 6.3%   Values used to calculate the score:     Age: 40 years     Sex: Female     Is Non-Hispanic African American: No     Diabetic: No     Tobacco smoker: No     Systolic Blood Pressure: 136 mmHg     Is BP treated: Yes     HDL Cholesterol: 59.8 mg/dL     Total Cholesterol: 189 mg/dL  Blood pressures reviewed as outlined.  Continue current medication regimen.  Follow pressures.  Follow metabolic panel. Continue diet and exercise.    Orders: -     Hepatic function panel; Future -     Basic metabolic panel; Future  History of colonic polyps Assessment & Plan: Had  recent incomplete colonoscopy.  Recommended colonography.  Was agreeable for f/u CT colonography.  Some confusion in scheduling.  Discussed today.  Agreeable to referral back to GI for further evaluation and question regarding next best step.    Primary osteoarthritis of left knee Assessment & Plan: Knee pain - bilateral as outlined.  Notify if desires further intervention.    Stress Assessment & Plan: Increased stress. Work stress.  Discussed. Does not feel needs any further intervention. Follow.    Other orders -     hydroCHLOROthiazide; Take 1 capsule (12.5 mg total) by mouth daily.  Dispense: 90 capsule; Refill: 1 -     Metoprolol Succinate ER; Take 1 tablet (100 mg total) by mouth daily with or immediately following a meal  Dispense: 90 tablet; Refill: 1 -     Valsartan; Take 1 tablet (160 mg total) by mouth daily.  Dispense: 90 tablet; Refill: 1     Dale [place], MD

## 2022-11-28 ENCOUNTER — Encounter: Payer: Self-pay | Admitting: Internal Medicine

## 2022-11-28 NOTE — Assessment & Plan Note (Signed)
Increased stress.  Work stress.  Discussed.  Does not feel needs any further intervention. Follow.

## 2022-11-28 NOTE — Assessment & Plan Note (Signed)
Had recent incomplete colonoscopy.  Recommended colonography.  Was agreeable for f/u CT colonography.  Some confusion in scheduling.  Discussed today.  Agreeable to referral back to GI for further evaluation and question regarding next best step.

## 2022-11-28 NOTE — Assessment & Plan Note (Signed)
Knee pain - bilateral as outlined.  Notify if desires further intervention.

## 2022-12-22 ENCOUNTER — Telehealth: Payer: Self-pay | Admitting: Internal Medicine

## 2022-12-22 NOTE — Telephone Encounter (Signed)
Kristy Vega previously saw Dr Mia Creek.  He had recommended a CT colonography.  She had tried to get this scheduled, but after last visit, prefers to have an appt scheduled to discuss with Dr Mia Creek regarding next step. Please schedule an appt with [facility] - f/u .  Thanks

## 2022-12-23 NOTE — Telephone Encounter (Signed)
FYI- patient scheduled with GI 05/10/23 and placed on cancellation list. Patient is aware of appt date and time. When I called her to give the appointment she stated that she had discussed wanting to wait on this and asked for the number for GI. Just wanted you to be aware.

## 2023-01-07 ENCOUNTER — Other Ambulatory Visit: Payer: Self-pay

## 2023-02-04 ENCOUNTER — Other Ambulatory Visit: Payer: Self-pay

## 2023-03-09 ENCOUNTER — Other Ambulatory Visit: Payer: Self-pay | Admitting: Internal Medicine

## 2023-03-09 DIAGNOSIS — Z1231 Encounter for screening mammogram for malignant neoplasm of breast: Secondary | ICD-10-CM

## 2023-03-17 DIAGNOSIS — H0289 Other specified disorders of eyelid: Secondary | ICD-10-CM | POA: Diagnosis not present

## 2023-03-17 DIAGNOSIS — H04123 Dry eye syndrome of bilateral lacrimal glands: Secondary | ICD-10-CM | POA: Diagnosis not present

## 2023-03-17 DIAGNOSIS — H2513 Age-related nuclear cataract, bilateral: Secondary | ICD-10-CM | POA: Diagnosis not present

## 2023-04-04 ENCOUNTER — Other Ambulatory Visit: Payer: Self-pay

## 2023-04-21 ENCOUNTER — Other Ambulatory Visit (INDEPENDENT_AMBULATORY_CARE_PROVIDER_SITE_OTHER): Payer: 59

## 2023-04-21 DIAGNOSIS — I10 Essential (primary) hypertension: Secondary | ICD-10-CM | POA: Diagnosis not present

## 2023-04-21 DIAGNOSIS — Z13220 Encounter for screening for lipoid disorders: Secondary | ICD-10-CM | POA: Diagnosis not present

## 2023-04-21 LAB — LIPID PANEL
Cholesterol: 182 mg/dL (ref 0–200)
HDL: 57.5 mg/dL (ref >39.00)
LDL Cholesterol: 100 mg/dL — ABNORMAL HIGH (ref 0–99)
NonHDL: 124.56
Total CHOL/HDL Ratio: 3
Triglycerides: 125 mg/dL (ref 0.0–149.0)
VLDL: 25 mg/dL (ref 0.0–40.0)

## 2023-04-21 LAB — BASIC METABOLIC PANEL
BUN: 16 mg/dL (ref 6–23)
CO2: 28 meq/L (ref 19–32)
Calcium: 8.7 mg/dL (ref 8.4–10.5)
Chloride: 104 meq/L (ref 96–112)
Creatinine, Ser: 0.63 mg/dL (ref 0.40–1.20)
GFR: 93.96 mL/min (ref >60.00)
Glucose, Bld: 99 mg/dL (ref 70–99)
Potassium: 3.8 meq/L (ref 3.5–5.1)
Sodium: 142 meq/L (ref 135–145)

## 2023-04-21 LAB — HEPATIC FUNCTION PANEL
ALT: 18 U/L (ref 0–35)
AST: 20 U/L (ref 0–37)
Albumin: 3.9 g/dL (ref 3.5–5.2)
Alkaline Phosphatase: 61 U/L (ref 39–117)
Bilirubin, Direct: 0.1 mg/dL (ref 0.0–0.3)
Total Bilirubin: 0.6 mg/dL (ref 0.2–1.2)
Total Protein: 6.9 g/dL (ref 6.0–8.3)

## 2023-04-26 ENCOUNTER — Other Ambulatory Visit (HOSPITAL_COMMUNITY)
Admission: RE | Admit: 2023-04-26 | Discharge: 2023-04-26 | Disposition: A | Discharge status: Home / Self Care | Payer: 59 | Source: Ambulatory Visit | Attending: Internal Medicine | Admitting: Internal Medicine

## 2023-04-26 ENCOUNTER — Encounter: Payer: Self-pay | Admitting: Internal Medicine

## 2023-04-26 ENCOUNTER — Ambulatory Visit
Admission: RE | Admit: 2023-04-26 | Discharge: 2023-04-26 | Disposition: A | Discharge status: Home / Self Care | Payer: 59 | Source: Ambulatory Visit | Attending: Internal Medicine | Admitting: Internal Medicine

## 2023-04-26 ENCOUNTER — Other Ambulatory Visit (HOSPITAL_COMMUNITY): Payer: Self-pay

## 2023-04-26 ENCOUNTER — Ambulatory Visit: Payer: 59 | Admitting: Internal Medicine

## 2023-04-26 ENCOUNTER — Other Ambulatory Visit: Payer: Self-pay

## 2023-04-26 VITALS — BP 130/74 | HR 83 | Ht 62.0 in | Wt 182.2 lb

## 2023-04-26 DIAGNOSIS — Z13220 Encounter for screening for lipoid disorders: Secondary | ICD-10-CM | POA: Diagnosis not present

## 2023-04-26 DIAGNOSIS — Z1231 Encounter for screening mammogram for malignant neoplasm of breast: Secondary | ICD-10-CM | POA: Insufficient documentation

## 2023-04-26 DIAGNOSIS — Z124 Encounter for screening for malignant neoplasm of cervix: Secondary | ICD-10-CM | POA: Insufficient documentation

## 2023-04-26 DIAGNOSIS — Z0000 Encounter for general adult medical examination without abnormal findings: Secondary | ICD-10-CM

## 2023-04-26 DIAGNOSIS — F439 Reaction to severe stress, unspecified: Secondary | ICD-10-CM

## 2023-04-26 DIAGNOSIS — I10 Essential (primary) hypertension: Secondary | ICD-10-CM

## 2023-04-26 MED ORDER — VALSARTAN 160 MG PO TABS
160.0000 mg | ORAL_TABLET | Freq: Every day | ORAL | 1 refills | Status: DC
  Filled 2023-04-26 – 2023-07-03 (×2): qty 90, 90d supply, fill #0
  Filled 2023-10-08: qty 90, 90d supply, fill #1

## 2023-04-26 MED ORDER — METOPROLOL SUCCINATE ER 100 MG PO TB24
100.0000 mg | ORAL_TABLET | Freq: Every day | ORAL | 1 refills | Status: DC
  Filled 2023-04-26: qty 90, 90d supply, fill #0
  Filled 2023-07-31: qty 90, 90d supply, fill #1

## 2023-04-26 MED ORDER — HYDROCHLOROTHIAZIDE 12.5 MG PO CAPS
12.5000 mg | ORAL_CAPSULE | Freq: Every day | ORAL | 1 refills | Status: DC
  Filled 2023-04-26 – 2023-07-03 (×2): qty 90, 90d supply, fill #0
  Filled 2023-10-08: qty 90, 90d supply, fill #1

## 2023-04-26 NOTE — Assessment & Plan Note (Signed)
Overall appears to be handling things relatively well. 

## 2023-04-26 NOTE — Assessment & Plan Note (Addendum)
  The 10-year ASCVD risk score (Arnett DK, et al., 2019) is: 6.4%   Values used to calculate the score:     Age: 63 years     Sex: Female     Is Non-Hispanic African American: No     Diabetic: No     Tobacco smoker: No     Systolic Blood Pressure: 130 mmHg     Is BP treated: Yes     HDL Cholesterol: 57.5 mg/dL     Total Cholesterol: 182 mg/dL  Blood pressures reviewed as outlined.  Continue current medication regimen.  Follow pressures.  Follow metabolic panel. Continue diet and exercise.

## 2023-04-26 NOTE — Progress Notes (Signed)
 Subjective:    Patient ID: Kristy Vega, female    DOB: 1960-08-09, 63 y.o.   MRN: [id number]  Patient here for  Chief Complaint  Patient presents with   Annual Exam    HPI Here for a physical exam. On diovan , metoprolol  and hydrochlorothiazide  for her blood pressure. She is doing relatively well. Plans to start walking. No chest pain or sob reported. No abdominal pain or bowel change reported. Handling stress. Discussed lab results.    Past Medical History:  Diagnosis Date   Basal cell carcinoma 06/08/2019   Right chin   History of colon polyps    Vaginal delivery    X 2   Past Surgical History:  Procedure Laterality Date   ABDOMINAL HYSTERECTOMY     Dr Bobie Cary, for menorrhagia   CHOLECYSTECTOMY     INCISION / DRAINAGE HAND / FINGER     removal of sewing needle   Family History  Problem Relation Age of Onset   Hypertension Mother    Cancer Father        prostate   Stroke Maternal Grandfather    Breast cancer Neg Hx    Social History   Socioeconomic History   Marital status: Married    Spouse name: Not on file   Number of children: Not on file   Years of education: Not on file   Highest education level: Associate degree: occupational, scientist, product/process development, or vocational program  Occupational History   Not on file  Tobacco Use   Smoking status: Never   Smokeless tobacco: Never  Substance and Sexual Activity   Alcohol use: Yes    Alcohol/week: 0.0 standard drinks of alcohol    Comment: occasional   Drug use: Not on file   Sexual activity: Not on file  Other Topics Concern   Not on file  Social History Narrative   Exercises regularly      Animals: Dogs and horses   Social Drivers of Health   Financial Resource Strain: Low Risk  (04/25/2023)   Overall Financial Resource Strain (CARDIA)    Difficulty of Paying Living Expenses: Not hard at all  Food Insecurity: No Food Insecurity (04/25/2023)   Hunger Vital Sign    Worried About Running Out of Food in the  Last Year: Never true    Ran Out of Food in the Last Year: Never true  Transportation Needs: No Transportation Needs (04/25/2023)   PRAPARE - Administrator, Civil Service (Medical): No    Lack of Transportation (Non-Medical): No  Physical Activity: Insufficiently Active (04/25/2023)   Exercise Vital Sign    Days of Exercise per Week: 2 days    Minutes of Exercise per Session: 30 min  Stress: No Stress Concern Present (04/25/2023)   [employer] of Occupational Health - Occupational Stress Questionnaire    Feeling of Stress : Not at all  Social Connections: Moderately Integrated (04/25/2023)   Social Connection and Isolation Panel [NHANES]    Frequency of Communication with Friends and Family: Once a week    Frequency of Social Gatherings with Friends and Family: Once a week    Attends Religious Services: More than 4 times per year    Active Member of [employer] or Organizations: Yes    Attends Engineer, Structural: More than 4 times per year    Marital Status: Married     Review of Systems  Constitutional:  Negative for appetite change and unexpected weight change.  HENT:  Negative for congestion, sinus pressure and sore throat.   Eyes:  Negative for pain and visual disturbance.  Respiratory:  Negative for cough, chest tightness and shortness of breath.   Cardiovascular:  Negative for chest pain, palpitations and leg swelling.  Gastrointestinal:  Negative for abdominal pain, diarrhea, nausea and vomiting.  Genitourinary:  Negative for difficulty urinating and dysuria.  Musculoskeletal:  Negative for joint swelling and myalgias.  Skin:  Negative for color change and rash.  Neurological:  Negative for dizziness and headaches.  Hematological:  Negative for adenopathy. Does not bruise/bleed easily.  Psychiatric/Behavioral:  Negative for agitation, decreased concentration and dysphoric mood.        Objective:     BP 130/74   Pulse 83   Ht 5' 2 (1.575 m)   Wt  182 lb 3.2 oz (82.6 kg)   SpO2 98%   BMI 33.32 kg/m  Wt Readings from Last 3 Encounters:  04/26/23 182 lb 3.2 oz (82.6 kg)  11/24/22 180 lb (81.6 kg)  07/20/22 174 lb (78.9 kg)    Physical Exam Vitals reviewed.  Constitutional:      General: She is not in acute distress.    Appearance: Normal appearance. She is well-developed.  HENT:     Head: Normocephalic and atraumatic.     Right Ear: External ear normal.     Left Ear: External ear normal.  Eyes:     General: No scleral icterus.       Right eye: No discharge.        Left eye: No discharge.     Conjunctiva/sclera: Conjunctivae normal.  Neck:     Thyroid : No thyromegaly.  Cardiovascular:     Rate and Rhythm: Normal rate and regular rhythm.  Pulmonary:     Effort: No tachypnea, accessory muscle usage or respiratory distress.     Breath sounds: Normal breath sounds. No decreased breath sounds or wheezing.  Chest:  Breasts:    Right: No inverted nipple, mass, nipple discharge or tenderness (no axillary adenopathy).     Left: No inverted nipple, mass, nipple discharge or tenderness (no axilarry adenopathy).  Abdominal:     General: Bowel sounds are normal.     Palpations: Abdomen is soft.     Tenderness: There is no abdominal tenderness.  Genitourinary:    Comments: Normal external genitalia.  Vaginal vault without lesions.  S/p hysterectomy. Pap smear of vaginal cuff performed.  Could not appreciate any adnexal masses or tenderness.   Musculoskeletal:        General: No swelling or tenderness.     Cervical back: Neck supple.  Lymphadenopathy:     Cervical: No cervical adenopathy.  Skin:    Findings: No erythema or rash.  Neurological:     Mental Status: She is alert and oriented to person, place, and time.  Psychiatric:        Mood and Affect: Mood normal.        Behavior: Behavior normal.         Outpatient Encounter Medications as of 04/26/2023  Medication Sig   Multiple Vitamin (MULTIVITAMIN) tablet Take 1  tablet by mouth daily.   [DISCONTINUED] hydrochlorothiazide  (MICROZIDE ) 12.5 MG capsule Take 1 capsule (12.5 mg total) by mouth daily.   [DISCONTINUED] metoprolol  succinate (TOPROL -XL) 100 MG 24 hr tablet Take 1 tablet (100 mg total) by mouth daily with or immediately following a meal   [DISCONTINUED] valsartan  (DIOVAN ) 160 MG tablet Take 1 tablet (160 mg total) by mouth daily.  hydrochlorothiazide  (MICROZIDE ) 12.5 MG capsule Take 1 capsule (12.5 mg total) by mouth daily.   metoprolol  succinate (TOPROL -XL) 100 MG 24 hr tablet Take 1 tablet (100 mg total) by mouth daily with or immediately following a meal   valsartan  (DIOVAN ) 160 MG tablet Take 1 tablet (160 mg total) by mouth daily.   [DISCONTINUED] mometasone  (ELOCON ) 0.1 % cream Apply twice daily to affected areas up to 2 weeks as needed for rash/itching   No facility-administered encounter medications on file as of 04/26/2023.     Lab Results  Component Value Date   WBC 6.0 04/19/2022   HGB 14.7 04/19/2022   HCT 42.8 04/19/2022   PLT 325.0 04/19/2022   GLUCOSE 99 04/21/2023   CHOL 182 04/21/2023   TRIG 125.0 04/21/2023   HDL 57.50 04/21/2023   LDLDIRECT 122.8 07/24/2012   LDLCALC 100 (H) 04/21/2023   ALT 18 04/21/2023   AST 20 04/21/2023   NA 142 04/21/2023   K 3.8 04/21/2023   CL 104 04/21/2023   CREATININE 0.63 04/21/2023   BUN 16 04/21/2023   CO2 28 04/21/2023   TSH 2.82 04/19/2022   INR 1.0 11/22/2016   HGBA1C 5.5 02/16/2016   MICROALBUR <0.7 09/02/2014    MM 3D SCREEN BREAST BILATERAL Result Date: 04/23/2022 CLINICAL DATA:  Screening. EXAM: DIGITAL SCREENING BILATERAL MAMMOGRAM WITH TOMOSYNTHESIS AND CAD TECHNIQUE: Bilateral screening digital craniocaudal and mediolateral oblique mammograms were obtained. Bilateral screening digital breast tomosynthesis was performed. The images were evaluated with computer-aided detection. COMPARISON:  Previous exam(s). ACR Breast Density Category c: The breast tissue is  heterogeneously dense, which may obscure small masses. FINDINGS: There are no findings suspicious for malignancy. IMPRESSION: No mammographic evidence of malignancy. A result letter of this screening mammogram will be mailed directly to the patient. RECOMMENDATION: Screening mammogram in one year. (Code:SM-B-01Y) BI-RADS CATEGORY  1: Negative. Electronically Signed   By: Rosaline Collet M.D.   On: 04/23/2022 09:46       Assessment & Plan:  Routine general medical examination at a health care facility  Health care maintenance Assessment & Plan: Physical today 04/26/23.  PAP 09/13/19-  Negative with negative HPV.  Repeat pap today. Colonoscopy 01/2021.  Recommended CT colonography.  Discussed last visit. Agreeable for referral back to GI.  Has appt with GI 05/10/23. Mammogram 04/22/22 - birads I. Due f/u mammogram. Has scheduled today  The 10-year ASCVD risk score (Arnett DK, et al., 2019) is: 7%   Values used to calculate the score:     Age: 64 years     Sex: Female     Is Non-Hispanic African American: No     Diabetic: No     Tobacco smoker: No     Systolic Blood Pressure: 136 mmHg     Is BP treated: Yes     HDL Cholesterol: 57.5 mg/dL     Total Cholesterol: 182 mg/dL    Essential hypertension, benign Assessment & Plan:   The 10-year ASCVD risk score (Arnett DK, et al., 2019) is: 6.4%   Values used to calculate the score:     Age: 6 years     Sex: Female     Is Non-Hispanic African American: No     Diabetic: No     Tobacco smoker: No     Systolic Blood Pressure: 130 mmHg     Is BP treated: Yes     HDL Cholesterol: 57.5 mg/dL     Total Cholesterol: 182 mg/dL  Blood pressures reviewed as  outlined.  Continue current medication regimen.  Follow pressures.  Follow metabolic panel. Continue diet and exercise.    Orders: -     CBC with Differential/Platelet; Future -     Basic metabolic panel; Future -     TSH; Future  Screening cholesterol level -     Lipid panel; Future -      Hepatic function panel; Future  Screening for cervical cancer -     Cytology - PAP  Stress Assessment & Plan: Overall appears to be handling things relatively well.    Other orders -     hydroCHLOROthiazide ; Take 1 capsule (12.5 mg total) by mouth daily.  Dispense: 90 capsule; Refill: 1 -     Metoprolol  Succinate ER; Take 1 tablet (100 mg total) by mouth daily with or immediately following a meal  Dispense: 90 tablet; Refill: 1 -     Valsartan ; Take 1 tablet (160 mg total) by mouth daily.  Dispense: 90 tablet; Refill: 1     Allena Hamilton, MD

## 2023-04-26 NOTE — Assessment & Plan Note (Addendum)
 Physical today 04/26/23.  PAP 09/13/19-  Negative with negative HPV.  Repeat pap today. Colonoscopy 01/2021.  Recommended CT colonography.  Discussed last visit. Agreeable for referral back to GI.  Has appt with GI 05/10/23. Mammogram 04/22/22 - birads I. Due f/u mammogram. Has scheduled today  The 10-year ASCVD risk score (Arnett DK, et al., 2019) is: 7%   Values used to calculate the score:     Age: 63 years     Sex: Female     Is Non-Hispanic African American: No     Diabetic: No     Tobacco smoker: No     Systolic Blood Pressure: 136 mmHg     Is BP treated: Yes     HDL Cholesterol: 57.5 mg/dL     Total Cholesterol: 182 mg/dL

## 2023-04-27 ENCOUNTER — Other Ambulatory Visit: Payer: Self-pay | Admitting: Internal Medicine

## 2023-04-27 DIAGNOSIS — R928 Other abnormal and inconclusive findings on diagnostic imaging of breast: Secondary | ICD-10-CM

## 2023-04-28 ENCOUNTER — Encounter: Payer: Self-pay | Admitting: *Deleted

## 2023-04-28 LAB — CYTOLOGY - PAP
Comment: NEGATIVE
Diagnosis: NEGATIVE
Diagnosis: REACTIVE
High risk HPV: NEGATIVE

## 2023-05-03 ENCOUNTER — Ambulatory Visit
Admission: RE | Admit: 2023-05-03 | Discharge: 2023-05-03 | Disposition: A | Discharge status: Home / Self Care | Payer: 59 | Source: Ambulatory Visit | Attending: Internal Medicine | Admitting: Internal Medicine

## 2023-05-03 ENCOUNTER — Ambulatory Visit
Admission: RE | Admit: 2023-05-03 | Discharge: 2023-05-03 | Discharge status: Home / Self Care | Payer: 59 | Source: Ambulatory Visit | Attending: Internal Medicine | Admitting: Internal Medicine

## 2023-05-03 DIAGNOSIS — R928 Other abnormal and inconclusive findings on diagnostic imaging of breast: Secondary | ICD-10-CM

## 2023-05-03 DIAGNOSIS — R92322 Mammographic fibroglandular density, left breast: Secondary | ICD-10-CM | POA: Diagnosis not present

## 2023-05-20 DIAGNOSIS — Z1211 Encounter for screening for malignant neoplasm of colon: Secondary | ICD-10-CM | POA: Diagnosis not present

## 2023-05-25 LAB — COLOGUARD: COLOGUARD: NEGATIVE

## 2023-05-25 LAB — EXTERNAL GENERIC LAB PROCEDURE: COLOGUARD: NEGATIVE

## 2023-06-02 ENCOUNTER — Ambulatory Visit: Payer: 59 | Admitting: Dermatology

## 2023-06-07 DIAGNOSIS — H04123 Dry eye syndrome of bilateral lacrimal glands: Secondary | ICD-10-CM | POA: Diagnosis not present

## 2023-06-07 DIAGNOSIS — H2513 Age-related nuclear cataract, bilateral: Secondary | ICD-10-CM | POA: Diagnosis not present

## 2023-06-27 ENCOUNTER — Encounter: Payer: Self-pay | Admitting: Internal Medicine

## 2023-06-29 ENCOUNTER — Other Ambulatory Visit: Payer: Self-pay

## 2023-06-29 DIAGNOSIS — M1711 Unilateral primary osteoarthritis, right knee: Secondary | ICD-10-CM | POA: Diagnosis not present

## 2023-06-29 DIAGNOSIS — S8391XA Sprain of unspecified site of right knee, initial encounter: Secondary | ICD-10-CM | POA: Diagnosis not present

## 2023-06-29 MED ORDER — MELOXICAM 7.5 MG PO TABS
7.5000 mg | ORAL_TABLET | Freq: Every day | ORAL | 0 refills | Status: AC
  Filled 2023-06-29: qty 30, 30d supply, fill #0

## 2023-07-04 ENCOUNTER — Other Ambulatory Visit: Payer: Self-pay

## 2023-08-01 ENCOUNTER — Other Ambulatory Visit (HOSPITAL_COMMUNITY): Payer: Self-pay

## 2023-08-08 ENCOUNTER — Encounter: Payer: Self-pay | Admitting: Internal Medicine

## 2023-08-09 NOTE — Telephone Encounter (Signed)
 Ok to establish care.

## 2023-08-23 DIAGNOSIS — M1711 Unilateral primary osteoarthritis, right knee: Secondary | ICD-10-CM | POA: Diagnosis not present

## 2023-10-08 ENCOUNTER — Other Ambulatory Visit (HOSPITAL_COMMUNITY): Payer: Self-pay

## 2023-10-20 ENCOUNTER — Other Ambulatory Visit (INDEPENDENT_AMBULATORY_CARE_PROVIDER_SITE_OTHER): Payer: 59

## 2023-10-20 DIAGNOSIS — Z13220 Encounter for screening for lipoid disorders: Secondary | ICD-10-CM

## 2023-10-20 DIAGNOSIS — I10 Essential (primary) hypertension: Secondary | ICD-10-CM | POA: Diagnosis not present

## 2023-10-20 LAB — BASIC METABOLIC PANEL WITH GFR
BUN: 11 mg/dL (ref 6–23)
CO2: 29 meq/L (ref 19–32)
Calcium: 8.8 mg/dL (ref 8.4–10.5)
Chloride: 104 meq/L (ref 96–112)
Creatinine, Ser: 0.64 mg/dL (ref 0.40–1.20)
GFR: 93.28 mL/min (ref >60.00)
Glucose, Bld: 102 mg/dL — ABNORMAL HIGH (ref 70–99)
Potassium: 4.1 meq/L (ref 3.5–5.1)
Sodium: 139 meq/L (ref 135–145)

## 2023-10-20 LAB — CBC WITH DIFFERENTIAL/PLATELET
Basophils Absolute: 0 K/uL (ref 0.0–0.1)
Basophils Relative: 0.8 % (ref 0.0–3.0)
Eosinophils Absolute: 0.2 K/uL (ref 0.0–0.7)
Eosinophils Relative: 3.2 % (ref 0.0–5.0)
HCT: 41 % (ref 36.0–46.0)
Hemoglobin: 14 g/dL (ref 12.0–15.0)
Lymphocytes Relative: 32.2 % (ref 12.0–46.0)
Lymphs Abs: 1.6 K/uL (ref 0.7–4.0)
MCHC: 34.1 g/dL (ref 30.0–36.0)
MCV: 96.9 fl (ref 78.0–100.0)
Monocytes Absolute: 0.4 K/uL (ref 0.1–1.0)
Monocytes Relative: 7.2 % (ref 3.0–12.0)
Neutro Abs: 2.7 K/uL (ref 1.4–7.7)
Neutrophils Relative %: 56.6 % (ref 43.0–77.0)
Platelets: 287 K/uL (ref 150.0–400.0)
RBC: 4.23 Mil/uL (ref 3.87–5.11)
RDW: 13.2 % (ref 11.5–15.5)
WBC: 4.9 K/uL (ref 4.0–10.5)

## 2023-10-20 LAB — LIPID PANEL
Cholesterol: 173 mg/dL (ref 0–200)
HDL: 62.7 mg/dL (ref >39.00)
LDL Cholesterol: 91 mg/dL (ref 0–99)
NonHDL: 110.72
Total CHOL/HDL Ratio: 3
Triglycerides: 100 mg/dL (ref 0.0–149.0)
VLDL: 20 mg/dL (ref 0.0–40.0)

## 2023-10-20 LAB — HEPATIC FUNCTION PANEL
ALT: 14 U/L (ref 0–35)
AST: 15 U/L (ref 0–37)
Albumin: 3.8 g/dL (ref 3.5–5.2)
Alkaline Phosphatase: 65 U/L (ref 39–117)
Bilirubin, Direct: 0 mg/dL (ref 0.0–0.3)
Total Bilirubin: 0.4 mg/dL (ref 0.2–1.2)
Total Protein: 6.9 g/dL (ref 6.0–8.3)

## 2023-10-20 LAB — TSH: TSH: 2.89 u[IU]/mL (ref 0.35–5.50)

## 2023-10-21 ENCOUNTER — Ambulatory Visit: Payer: Self-pay | Admitting: Internal Medicine

## 2023-10-24 ENCOUNTER — Ambulatory Visit: Payer: 59 | Admitting: Internal Medicine

## 2023-11-01 ENCOUNTER — Ambulatory Visit: Admitting: Internal Medicine

## 2023-11-01 ENCOUNTER — Other Ambulatory Visit: Payer: Self-pay

## 2023-11-01 VITALS — BP 124/70 | HR 77 | Resp 16 | Ht 62.0 in | Wt 184.0 lb

## 2023-11-01 DIAGNOSIS — Z13220 Encounter for screening for lipoid disorders: Secondary | ICD-10-CM | POA: Diagnosis not present

## 2023-11-01 DIAGNOSIS — F439 Reaction to severe stress, unspecified: Secondary | ICD-10-CM | POA: Diagnosis not present

## 2023-11-01 DIAGNOSIS — I10 Essential (primary) hypertension: Secondary | ICD-10-CM | POA: Diagnosis not present

## 2023-11-01 DIAGNOSIS — Z860100 Personal history of colon polyps, unspecified: Secondary | ICD-10-CM | POA: Diagnosis not present

## 2023-11-01 MED ORDER — METOPROLOL SUCCINATE ER 100 MG PO TB24
100.0000 mg | ORAL_TABLET | Freq: Every day | ORAL | 1 refills | Status: DC
  Filled 2023-11-01: qty 90, 90d supply, fill #0

## 2023-11-01 MED ORDER — HYDROCHLOROTHIAZIDE 12.5 MG PO CAPS
12.5000 mg | ORAL_CAPSULE | Freq: Every day | ORAL | 1 refills | Status: DC
  Filled 2023-11-01: qty 90, 90d supply, fill #0

## 2023-11-01 MED ORDER — VALSARTAN 160 MG PO TABS
160.0000 mg | ORAL_TABLET | Freq: Every day | ORAL | 1 refills | Status: DC
  Filled 2023-11-01: qty 90, 90d supply, fill #0

## 2023-11-01 NOTE — Assessment & Plan Note (Addendum)
  The 10-year ASCVD risk score (Arnett DK, et al., 2019) is: 6.9%   Values used to calculate the score:     Age: 63 years     Clincally relevant sex: Female     Is Non-Hispanic African American: No     Diabetic: No     Tobacco smoker: No     Systolic Blood Pressure: 140 mmHg     Is BP treated: Yes     HDL Cholesterol: 62.7 mg/dL     Total Cholesterol: 173 mg/dL  Blood pressures reviewed as outlined.  Continue current medication regimen.  Follow pressures.  Follow metabolic panel.

## 2023-11-01 NOTE — Progress Notes (Signed)
 Subjective:    Patient ID: Kristy Vega, female    DOB: 01/26/1961, 63 y.o.   MRN: [id number]  Patient here for  Chief Complaint  Patient presents with   Medical Management of Chronic Issues    HPI Here for a scheduled follow up - follow up regarding hypertension. Continues on diovan , metoprolol  and hydrochlorothiazide . Had f/u with GI 05/12/23 - recommended cologuard. Cologuard - negative 05/2023. Seeing ortho - right knee arthritis. S/p cortisone injection. Helped. Overall she feels she is doing well.    Past Medical History:  Diagnosis Date   Basal cell carcinoma 06/08/2019   Right chin   History of colon polyps    Vaginal delivery    X 2   Past Surgical History:  Procedure Laterality Date   ABDOMINAL HYSTERECTOMY     Dr Bobie Cary, for menorrhagia   CHOLECYSTECTOMY     INCISION / DRAINAGE HAND / FINGER     removal of sewing needle   Family History  Problem Relation Age of Onset   Hypertension Mother    Cancer Father        prostate   Stroke Maternal Grandfather    Breast cancer Neg Hx    Social History   Socioeconomic History   Marital status: Married    Spouse name: Not on file   Number of children: Not on file   Years of education: Not on file   Highest education level: Associate degree: occupational, Scientist, product/process development, or vocational program  Occupational History   Not on file  Tobacco Use   Smoking status: Never   Smokeless tobacco: Never  Substance and Sexual Activity   Alcohol use: Yes    Alcohol/week: 0.0 standard drinks of alcohol    Comment: occasional   Drug use: Not on file   Sexual activity: Not on file  Other Topics Concern   Not on file  Social History Narrative   Exercises regularly      Animals: Dogs and horses   Social Drivers of Health   Financial Resource Strain: Low Risk  (04/25/2023)   Overall Financial Resource Strain (CARDIA)    Difficulty of Paying Living Expenses: Not hard at all  Food Insecurity: No Food Insecurity (04/25/2023)    Hunger Vital Sign    Worried About Running Out of Food in the Last Year: Never true    Ran Out of Food in the Last Year: Never true  Transportation Needs: No Transportation Needs (04/25/2023)   PRAPARE - Administrator, Civil Service (Medical): No    Lack of Transportation (Non-Medical): No  Physical Activity: Insufficiently Active (04/25/2023)   Exercise Vital Sign    Days of Exercise per Week: 2 days    Minutes of Exercise per Session: 30 min  Stress: No Stress Concern Present (04/25/2023)   [employer] of Occupational Health - Occupational Stress Questionnaire    Feeling of Stress : Not at all  Social Connections: Moderately Integrated (04/25/2023)   Social Connection and Isolation Panel    Frequency of Communication with Friends and Family: Once a week    Frequency of Social Gatherings with Friends and Family: Once a week    Attends Religious Services: More than 4 times per year    Active Member of [employer] or Organizations: Yes    Attends Engineer, structural: More than 4 times per year    Marital Status: Married     Review of Systems  Constitutional:  Negative for appetite change  and unexpected weight change.  HENT:  Negative for congestion and sinus pressure.   Respiratory:  Negative for cough, chest tightness and shortness of breath.   Cardiovascular:  Negative for chest pain, palpitations and leg swelling.  Gastrointestinal:  Negative for abdominal pain, diarrhea, nausea and vomiting.  Genitourinary:  Negative for difficulty urinating and dysuria.  Musculoskeletal:  Negative for joint swelling and myalgias.       Knee better s/p injection.   Skin:  Negative for color change and rash.  Neurological:  Negative for dizziness and headaches.  Psychiatric/Behavioral:  Negative for agitation and dysphoric mood.        Objective:     BP 124/70   Pulse 77   Resp 16   Ht 5' 2 (1.575 m)   Wt 184 lb (83.5 kg)   SpO2 98%   BMI 33.65 kg/m  Wt  Readings from Last 3 Encounters:  11/01/23 184 lb (83.5 kg)  04/26/23 182 lb 3.2 oz (82.6 kg)  11/24/22 180 lb (81.6 kg)    Physical Exam Vitals reviewed.  Constitutional:      General: She is not in acute distress.    Appearance: Normal appearance.  HENT:     Head: Normocephalic and atraumatic.     Right Ear: External ear normal.     Left Ear: External ear normal.     Mouth/Throat:     Pharynx: No oropharyngeal exudate or posterior oropharyngeal erythema.  Eyes:     General: No scleral icterus.       Right eye: No discharge.        Left eye: No discharge.     Conjunctiva/sclera: Conjunctivae normal.  Neck:     Thyroid : No thyromegaly.  Cardiovascular:     Rate and Rhythm: Normal rate and regular rhythm.  Pulmonary:     Effort: No respiratory distress.     Breath sounds: Normal breath sounds. No wheezing.  Abdominal:     General: Bowel sounds are normal.     Palpations: Abdomen is soft.     Tenderness: There is no abdominal tenderness.  Musculoskeletal:        General: No swelling or tenderness.     Cervical back: Neck supple. No tenderness.  Lymphadenopathy:     Cervical: No cervical adenopathy.  Skin:    Findings: No erythema or rash.  Neurological:     Mental Status: She is alert.  Psychiatric:        Mood and Affect: Mood normal.        Behavior: Behavior normal.         Outpatient Encounter Medications as of 11/01/2023  Medication Sig   meloxicam  (MOBIC ) 7.5 MG tablet Take 1 tablet (7.5 mg total) by mouth daily.   Multiple Vitamin (MULTIVITAMIN) tablet Take 1 tablet by mouth daily.   [DISCONTINUED] hydrochlorothiazide  (MICROZIDE ) 12.5 MG capsule Take 1 capsule (12.5 mg total) by mouth daily.   [DISCONTINUED] hydrochlorothiazide  (MICROZIDE ) 12.5 MG capsule Take 1 capsule (12.5 mg total) by mouth daily.   [DISCONTINUED] metoprolol  succinate (TOPROL -XL) 100 MG 24 hr tablet Take 1 tablet (100 mg total) by mouth daily with or immediately following a meal    [DISCONTINUED] metoprolol  succinate (TOPROL -XL) 100 MG 24 hr tablet Take 1 tablet (100 mg total) by mouth daily with or immediately following a meal   [DISCONTINUED] valsartan  (DIOVAN ) 160 MG tablet Take 1 tablet (160 mg total) by mouth daily.   [DISCONTINUED] valsartan  (DIOVAN ) 160 MG tablet Take 1 tablet (160  mg total) by mouth daily.   No facility-administered encounter medications on file as of 11/01/2023.     Lab Results  Component Value Date   WBC 4.9 10/20/2023   HGB 14.0 10/20/2023   HCT 41.0 10/20/2023   PLT 287.0 10/20/2023   GLUCOSE 102 (H) 10/20/2023   CHOL 173 10/20/2023   TRIG 100.0 10/20/2023   HDL 62.70 10/20/2023   LDLDIRECT 122.8 07/24/2012   LDLCALC 91 10/20/2023   ALT 14 10/20/2023   AST 15 10/20/2023   NA 139 10/20/2023   K 4.1 10/20/2023   CL 104 10/20/2023   CREATININE 0.64 10/20/2023   BUN 11 10/20/2023   CO2 29 10/20/2023   TSH 2.89 10/20/2023   INR 1.0 11/22/2016   HGBA1C 5.5 02/16/2016    MM 3D DIAGNOSTIC MAMMOGRAM UNILATERAL LEFT BREAST Result Date: 05/03/2023 CLINICAL DATA:  LEFT-sided callback EXAM: DIGITAL DIAGNOSTIC UNILATERAL LEFT MAMMOGRAM WITH TOMOSYNTHESIS AND CAD; ULTRASOUND LEFT BREAST LIMITED TECHNIQUE: Left digital diagnostic mammography and breast tomosynthesis was performed. The images were evaluated with computer-aided detection. ; Targeted ultrasound examination of the left breast was performed. COMPARISON:  Previous exam(s). ACR Breast Density Category b: There are scattered areas of fibroglandular density. FINDINGS: The previously described finding does not persist with additional views, consistent with superimposed fibroglandular tissue. No suspicious mass, microcalcification, or other finding is identified. On physical exam, no suspicious mass is appreciated. Targeted ultrasound was performed of the LEFT upper breast. No suspicious cystic or solid mass is seen at the site of screening mammographic concern. IMPRESSION: No mammographic or  sonographic evidence of malignancy at the site of screening mammographic concern. RECOMMENDATION: Screening mammogram in one year.(Code:SM-B-01Y) I have discussed the findings and recommendations with the patient. If applicable, a reminder letter will be sent to the patient regarding the next appointment. BI-RADS CATEGORY  1: Negative. Electronically Signed   By: Corean Salter M.D.   On: 05/03/2023 15:26   US  LIMITED ULTRASOUND INCLUDING AXILLA LEFT BREAST  Result Date: 05/03/2023 CLINICAL DATA:  LEFT-sided callback EXAM: DIGITAL DIAGNOSTIC UNILATERAL LEFT MAMMOGRAM WITH TOMOSYNTHESIS AND CAD; ULTRASOUND LEFT BREAST LIMITED TECHNIQUE: Left digital diagnostic mammography and breast tomosynthesis was performed. The images were evaluated with computer-aided detection. ; Targeted ultrasound examination of the left breast was performed. COMPARISON:  Previous exam(s). ACR Breast Density Category b: There are scattered areas of fibroglandular density. FINDINGS: The previously described finding does not persist with additional views, consistent with superimposed fibroglandular tissue. No suspicious mass, microcalcification, or other finding is identified. On physical exam, no suspicious mass is appreciated. Targeted ultrasound was performed of the LEFT upper breast. No suspicious cystic or solid mass is seen at the site of screening mammographic concern. IMPRESSION: No mammographic or sonographic evidence of malignancy at the site of screening mammographic concern. RECOMMENDATION: Screening mammogram in one year.(Code:SM-B-01Y) I have discussed the findings and recommendations with the patient. If applicable, a reminder letter will be sent to the patient regarding the next appointment. BI-RADS CATEGORY  1: Negative. Electronically Signed   By: Corean Salter M.D.   On: 05/03/2023 15:26       Assessment & Plan:  History of colonic polyps Assessment & Plan: Had previous incomplete colonoscopy.  Recommended  colonography.  Had f/u with GI 05/12/23. Recommended cologuard. Cologuard 05/2023 - negative.    Essential hypertension, benign Assessment & Plan:   The 10-year ASCVD risk score (Arnett DK, et al., 2019) is: 6.9%   Values used to calculate the score:     Age:  62 years     Clincally relevant sex: Female     Is Non-Hispanic African American: No     Diabetic: No     Tobacco smoker: No     Systolic Blood Pressure: 140 mmHg     Is BP treated: Yes     HDL Cholesterol: 62.7 mg/dL     Total Cholesterol: 173 mg/dL  Blood pressures reviewed as outlined.  Continue current medication regimen.  Follow pressures.  Follow metabolic panel.   Orders: -     Basic metabolic panel with GFR; Future  Screening cholesterol level -     Lipid panel; Future -     Hepatic function panel; Future  Stress Assessment & Plan: Overall appears to be doing well. Follow.       Allena Hamilton, MD

## 2023-11-01 NOTE — Assessment & Plan Note (Signed)
 Had previous incomplete colonoscopy.  Recommended colonography.  Had f/u with GI 05/12/23. Recommended cologuard. Cologuard 05/2023 - negative.

## 2023-11-01 NOTE — Patient Instructions (Signed)
 Magnesium glycinate - one before bed

## 2023-11-02 ENCOUNTER — Other Ambulatory Visit: Payer: Self-pay

## 2023-11-02 ENCOUNTER — Encounter: Payer: Self-pay | Admitting: Internal Medicine

## 2023-11-02 ENCOUNTER — Other Ambulatory Visit (HOSPITAL_COMMUNITY): Payer: Self-pay

## 2023-11-02 MED ORDER — METOPROLOL SUCCINATE ER 100 MG PO TB24
100.0000 mg | ORAL_TABLET | Freq: Every day | ORAL | 1 refills | Status: DC
  Filled 2023-11-02 (×2): qty 90, 90d supply, fill #0
  Filled 2024-02-01: qty 90, 90d supply, fill #1

## 2023-11-02 MED ORDER — METOPROLOL SUCCINATE ER 100 MG PO TB24
100.0000 mg | ORAL_TABLET | Freq: Every day | ORAL | 1 refills | Status: DC
  Filled 2023-11-02: qty 90, 90d supply, fill #0

## 2023-11-02 MED ORDER — VALSARTAN 160 MG PO TABS
160.0000 mg | ORAL_TABLET | Freq: Every day | ORAL | 1 refills | Status: AC
  Filled 2023-11-02 – 2024-01-03 (×2): qty 90, 90d supply, fill #0

## 2023-11-02 MED ORDER — HYDROCHLOROTHIAZIDE 12.5 MG PO CAPS
12.5000 mg | ORAL_CAPSULE | Freq: Every day | ORAL | 1 refills | Status: DC
  Filled 2023-11-02: qty 90, 90d supply, fill #0

## 2023-11-02 MED ORDER — VALSARTAN 160 MG PO TABS
160.0000 mg | ORAL_TABLET | Freq: Every day | ORAL | 1 refills | Status: DC
  Filled 2023-11-02: qty 90, 90d supply, fill #0

## 2023-11-02 MED ORDER — HYDROCHLOROTHIAZIDE 12.5 MG PO CAPS
12.5000 mg | ORAL_CAPSULE | Freq: Every day | ORAL | 1 refills | Status: AC
  Filled 2023-11-02 – 2024-01-03 (×2): qty 90, 90d supply, fill #0

## 2023-11-06 ENCOUNTER — Encounter: Payer: Self-pay | Admitting: Internal Medicine

## 2023-11-06 NOTE — Assessment & Plan Note (Signed)
 Overall appears to be doing well.  Follow.

## 2023-11-08 ENCOUNTER — Other Ambulatory Visit: Payer: Self-pay

## 2023-11-08 ENCOUNTER — Other Ambulatory Visit (HOSPITAL_COMMUNITY): Payer: Self-pay

## 2023-12-14 ENCOUNTER — Other Ambulatory Visit: Payer: Self-pay

## 2023-12-14 MED ORDER — CLINDAMYCIN HCL 150 MG PO CAPS
ORAL_CAPSULE | ORAL | 0 refills | Status: AC
  Filled 2023-12-14: qty 25, 6d supply, fill #0

## 2024-01-03 ENCOUNTER — Other Ambulatory Visit (HOSPITAL_COMMUNITY): Payer: Self-pay

## 2024-01-03 ENCOUNTER — Other Ambulatory Visit: Payer: Self-pay

## 2024-01-13 DIAGNOSIS — M1711 Unilateral primary osteoarthritis, right knee: Secondary | ICD-10-CM | POA: Diagnosis not present

## 2024-01-14 ENCOUNTER — Encounter: Payer: Self-pay | Admitting: Internal Medicine

## 2024-01-14 DIAGNOSIS — M25561 Pain in right knee: Secondary | ICD-10-CM | POA: Insufficient documentation

## 2024-02-01 ENCOUNTER — Other Ambulatory Visit: Payer: Self-pay

## 2024-03-06 ENCOUNTER — Other Ambulatory Visit: Payer: Self-pay

## 2024-03-06 MED ORDER — CLINDAMYCIN HCL 150 MG PO CAPS
ORAL_CAPSULE | ORAL | 0 refills | Status: AC
Start: 2024-02-23 — End: ?
  Filled 2024-03-06: qty 25, 6d supply, fill #0

## 2024-03-30 ENCOUNTER — Encounter: Payer: Self-pay | Admitting: Internal Medicine

## 2024-04-03 ENCOUNTER — Other Ambulatory Visit (HOSPITAL_COMMUNITY): Payer: Self-pay

## 2024-04-23 ENCOUNTER — Other Ambulatory Visit (INDEPENDENT_AMBULATORY_CARE_PROVIDER_SITE_OTHER): Payer: Medicare (Managed Care)

## 2024-04-23 DIAGNOSIS — Z13220 Encounter for screening for lipoid disorders: Secondary | ICD-10-CM | POA: Diagnosis not present

## 2024-04-23 DIAGNOSIS — I10 Essential (primary) hypertension: Secondary | ICD-10-CM | POA: Diagnosis not present

## 2024-04-23 LAB — BASIC METABOLIC PANEL WITH GFR
BUN: 16 mg/dL (ref 6–23)
CO2: 30 meq/L (ref 19–32)
Calcium: 8.7 mg/dL (ref 8.4–10.5)
Chloride: 102 meq/L (ref 96–112)
Creatinine, Ser: 0.61 mg/dL (ref 0.40–1.20)
GFR: 94.03 mL/min
Glucose, Bld: 98 mg/dL (ref 70–99)
Potassium: 4.4 meq/L (ref 3.5–5.1)
Sodium: 139 meq/L (ref 135–145)

## 2024-04-23 LAB — HEPATIC FUNCTION PANEL
ALT: 14 U/L (ref 3–35)
AST: 16 U/L (ref 5–37)
Albumin: 3.9 g/dL (ref 3.5–5.2)
Alkaline Phosphatase: 67 U/L (ref 39–117)
Bilirubin, Direct: 0.1 mg/dL (ref 0.1–0.3)
Total Bilirubin: 0.5 mg/dL (ref 0.2–1.2)
Total Protein: 6.8 g/dL (ref 6.0–8.3)

## 2024-04-23 LAB — LIPID PANEL
Cholesterol: 186 mg/dL (ref 28–200)
HDL: 62.7 mg/dL
LDL Cholesterol: 105 mg/dL — ABNORMAL HIGH (ref 10–99)
NonHDL: 123.72
Total CHOL/HDL Ratio: 3
Triglycerides: 92 mg/dL (ref 10.0–149.0)
VLDL: 18.4 mg/dL (ref 0.0–40.0)

## 2024-04-24 ENCOUNTER — Ambulatory Visit: Payer: Self-pay | Admitting: Internal Medicine

## 2024-04-26 ENCOUNTER — Encounter: Payer: Self-pay | Admitting: Internal Medicine

## 2024-04-26 ENCOUNTER — Other Ambulatory Visit: Payer: Self-pay | Admitting: Internal Medicine

## 2024-04-26 ENCOUNTER — Ambulatory Visit: Payer: Medicare (Managed Care) | Admitting: Internal Medicine

## 2024-04-26 DIAGNOSIS — Z0000 Encounter for general adult medical examination without abnormal findings: Secondary | ICD-10-CM

## 2024-04-26 DIAGNOSIS — Z1231 Encounter for screening mammogram for malignant neoplasm of breast: Secondary | ICD-10-CM

## 2024-04-26 DIAGNOSIS — E2839 Other primary ovarian failure: Secondary | ICD-10-CM

## 2024-04-26 NOTE — Progress Notes (Signed)
 Patient ID: Kristy Vega, female   DOB: 1960/09/10, 64 y.o.   MRN: [id number] Did not show for appt

## 2024-04-26 NOTE — Assessment & Plan Note (Addendum)
 Did not show for physical on 04/26/24.  PAP 04/26/23-  Negative with negative HPV. . Colonoscopy 01/2021.  Recommended CT colonography.  Discussed last visit. Agreeable for referral back to GI.  Was scheduled appt with GI 05/10/23. Cologuard 05/2023 - negative. Mammogram 04/27/23 - recommended f/u left breast mammogram. F/u left breast mammogram 05/03/23 - birads I. Recommended f/u screening mammogram in one year. Need to schedule.  The 10-year ASCVD risk score (Arnett DK, et al., 2019) is: 6.4%   Values used to calculate the score:     Age: 64 years     Clinically relevant sex: Female     Is Non-Hispanic African American: No     Diabetic: No     Tobacco smoker: No     Systolic Blood Pressure: 124 mmHg     Is BP treated: Yes     HDL Cholesterol: 62.7 mg/dL     Total Cholesterol: 186 mg/dL

## 2024-04-26 NOTE — Progress Notes (Deleted)
 "  Subjective:    Patient ID: Kristy Vega, female    DOB: 03-11-1961, 64 y.o.   MRN: [id number]  Patient here for No chief complaint on file.   HPI Here for a physical exam. Has seen ortho - right knee arthritis s/p sprain of the knee. Prescribed meloxicam , hinge brace, ice. Follow up 08/2023-and 01/13/24 - s/p injection. Discussed gel injections.    Past Medical History:  Diagnosis Date   Basal cell carcinoma 06/08/2019   Right chin   History of colon polyps    Vaginal delivery    X 2   Past Surgical History:  Procedure Laterality Date   ABDOMINAL HYSTERECTOMY     Dr Bobie Cary, for menorrhagia   CHOLECYSTECTOMY     INCISION / DRAINAGE HAND / FINGER     removal of sewing needle   Family History  Problem Relation Age of Onset   Hypertension Mother    Cancer Father        prostate   Stroke Maternal Grandfather    Breast cancer Neg Hx    Social History   Socioeconomic History   Marital status: Married    Spouse name: Not on file   Number of children: Not on file   Years of education: Not on file   Highest education level: Associate degree: occupational, scientist, product/process development, or vocational program  Occupational History   Not on file  Tobacco Use   Smoking status: Never   Smokeless tobacco: Never  Substance and Sexual Activity   Alcohol use: Yes    Alcohol/week: 0.0 standard drinks of alcohol    Comment: occasional   Drug use: Not on file   Sexual activity: Not on file  Other Topics Concern   Not on file  Social History Narrative   Exercises regularly      Animals: Dogs and horses   Social Drivers of Health   Tobacco Use: Low Risk (11/06/2023)   Patient History    Smoking Tobacco Use: Never    Smokeless Tobacco Use: Never    Passive Exposure: Not on file  Financial Resource Strain: Low Risk (04/25/2023)   Overall Financial Resource Strain (CARDIA)    Difficulty of Paying Living Expenses: Not hard at all  Food Insecurity: No Food Insecurity (04/25/2023)   Hunger  Vital Sign    Worried About Running Out of Food in the Last Year: Never true    Ran Out of Food in the Last Year: Never true  Transportation Needs: No Transportation Needs (04/25/2023)   PRAPARE - Administrator, Civil Service (Medical): No    Lack of Transportation (Non-Medical): No  Physical Activity: Insufficiently Active (04/25/2023)   Exercise Vital Sign    Days of Exercise per Week: 2 days    Minutes of Exercise per Session: 30 min  Stress: No Stress Concern Present (04/25/2023)   [employer] of Occupational Health - Occupational Stress Questionnaire    Feeling of Stress : Not at all  Social Connections: Moderately Integrated (04/25/2023)   Social Connection and Isolation Panel    Frequency of Communication with Friends and Family: Once a week    Frequency of Social Gatherings with Friends and Family: Once a week    Attends Religious Services: More than 4 times per year    Active Member of Clubs or Organizations: Yes    Attends Banker Meetings: More than 4 times per year    Marital Status: Married  Depression (PHQ2-9): Low Risk (04/26/2023)  Depression (PHQ2-9)    PHQ-2 Score: 0  Alcohol Screen: Low Risk (04/25/2023)   Alcohol Screen    Last Alcohol Screening Score (AUDIT): 3  Housing: Unknown (05/09/2023)   Received from [facility]   Epic    At any time in the past 12 months, were you homeless or living in a shelter (including now)?: No    Number of Times Moved in the Last Year: Not on file    Unable to Pay for Housing in the Last Year: Not on file  Utilities: Not on file  Health Literacy: Not on file     Review of Systems     Objective:     There were no vitals taken for this visit. Wt Readings from Last 3 Encounters:  11/01/23 184 lb (83.5 kg)  04/26/23 182 lb 3.2 oz (82.6 kg)  11/24/22 180 lb (81.6 kg)    Physical Exam  {Perform Simple Foot Exam  Perform Detailed exam:1} {Insert foot Exam (Optional):30965}    Outpatient Encounter Medications as of 04/26/2024  Medication Sig   clindamycin  (CLEOCIN ) 150 MG capsule Take 2 capsules by mouth STAT, Then 1 capsule every 6 hours until gone   hydrochlorothiazide  (MICROZIDE ) 12.5 MG capsule Take 1 capsule (12.5 mg total) by mouth daily.   meloxicam  (MOBIC ) 7.5 MG tablet Take 1 tablet (7.5 mg total) by mouth daily.   metoprolol  succinate (TOPROL -XL) 100 MG 24 hr tablet Take 1 tablet (100 mg total) by mouth daily with or immediately following a meal   Multiple Vitamin (MULTIVITAMIN) tablet Take 1 tablet by mouth daily.   valsartan  (DIOVAN ) 160 MG tablet Take 1 tablet (160 mg total) by mouth daily.   No facility-administered encounter medications on file as of 04/26/2024.     Lab Results  Component Value Date   WBC 4.9 10/20/2023   HGB 14.0 10/20/2023   HCT 41.0 10/20/2023   PLT 287.0 10/20/2023   GLUCOSE 98 04/23/2024   CHOL 186 04/23/2024   TRIG 92.0 04/23/2024   HDL 62.70 04/23/2024   LDLDIRECT 122.8 07/24/2012   LDLCALC 105 (H) 04/23/2024   ALT 14 04/23/2024   AST 16 04/23/2024   NA 139 04/23/2024   K 4.4 04/23/2024   CL 102 04/23/2024   CREATININE 0.61 04/23/2024   BUN 16 04/23/2024   CO2 30 04/23/2024   TSH 2.89 10/20/2023   INR 1.0 11/22/2016   HGBA1C 5.5 02/16/2016    MM 3D DIAGNOSTIC MAMMOGRAM UNILATERAL LEFT BREAST Result Date: 05/03/2023 CLINICAL DATA:  LEFT-sided callback EXAM: DIGITAL DIAGNOSTIC UNILATERAL LEFT MAMMOGRAM WITH TOMOSYNTHESIS AND CAD; ULTRASOUND LEFT BREAST LIMITED TECHNIQUE: Left digital diagnostic mammography and breast tomosynthesis was performed. The images were evaluated with computer-aided detection. ; Targeted ultrasound examination of the left breast was performed. COMPARISON:  Previous exam(s). ACR Breast Density Category b: There are scattered areas of fibroglandular density. FINDINGS: The previously described finding does not persist with additional views, consistent with superimposed fibroglandular tissue.  No suspicious mass, microcalcification, or other finding is identified. On physical exam, no suspicious mass is appreciated. Targeted ultrasound was performed of the LEFT upper breast. No suspicious cystic or solid mass is seen at the site of screening mammographic concern. IMPRESSION: No mammographic or sonographic evidence of malignancy at the site of screening mammographic concern. RECOMMENDATION: Screening mammogram in one year.(Code:SM-B-01Y) I have discussed the findings and recommendations with the patient. If applicable, a reminder letter will be sent to the patient regarding the next appointment. BI-RADS CATEGORY  1:  Negative. Electronically Signed   By: Corean Salter M.D.   On: 05/03/2023 15:26   US  LIMITED ULTRASOUND INCLUDING AXILLA LEFT BREAST  Result Date: 05/03/2023 CLINICAL DATA:  LEFT-sided callback EXAM: DIGITAL DIAGNOSTIC UNILATERAL LEFT MAMMOGRAM WITH TOMOSYNTHESIS AND CAD; ULTRASOUND LEFT BREAST LIMITED TECHNIQUE: Left digital diagnostic mammography and breast tomosynthesis was performed. The images were evaluated with computer-aided detection. ; Targeted ultrasound examination of the left breast was performed. COMPARISON:  Previous exam(s). ACR Breast Density Category b: There are scattered areas of fibroglandular density. FINDINGS: The previously described finding does not persist with additional views, consistent with superimposed fibroglandular tissue. No suspicious mass, microcalcification, or other finding is identified. On physical exam, no suspicious mass is appreciated. Targeted ultrasound was performed of the LEFT upper breast. No suspicious cystic or solid mass is seen at the site of screening mammographic concern. IMPRESSION: No mammographic or sonographic evidence of malignancy at the site of screening mammographic concern. RECOMMENDATION: Screening mammogram in one year.(Code:SM-B-01Y) I have discussed the findings and recommendations with the patient. If applicable, a  reminder letter will be sent to the patient regarding the next appointment. BI-RADS CATEGORY  1: Negative. Electronically Signed   By: Corean Salter M.D.   On: 05/03/2023 15:26       Assessment & Plan:  Health care maintenance  Encounter for screening mammogram for malignant neoplasm of breast  Estrogen deficiency     Allena Hamilton, MD "

## 2024-04-28 ENCOUNTER — Telehealth: Payer: Self-pay | Admitting: Internal Medicine

## 2024-04-28 NOTE — Telephone Encounter (Signed)
Notified of lab results via my chart.  

## 2024-05-02 ENCOUNTER — Other Ambulatory Visit: Payer: Self-pay

## 2024-05-02 ENCOUNTER — Encounter: Payer: Self-pay | Admitting: Internal Medicine

## 2024-05-02 MED ORDER — METOPROLOL SUCCINATE ER 100 MG PO TB24: 100.0000 mg | ORAL_TABLET | Freq: Every day | ORAL | 1 refills | Status: AC

## 2024-05-16 ENCOUNTER — Ambulatory Visit
Admission: RE | Admit: 2024-05-16 | Discharge: 2024-05-16 | Disposition: A | Payer: Medicare (Managed Care) | Source: Ambulatory Visit | Attending: Internal Medicine | Admitting: Internal Medicine

## 2024-05-16 DIAGNOSIS — Z1231 Encounter for screening mammogram for malignant neoplasm of breast: Secondary | ICD-10-CM

## 2024-06-06 ENCOUNTER — Encounter: Payer: Medicare (Managed Care) | Admitting: Internal Medicine
